# Patient Record
Sex: Female | Born: 1960 | Race: Black or African American | Hispanic: No | Marital: Single | Smoking: Current some day smoker
Health system: Southern US, Community
[De-identification: ages and names within clinical notes are randomized; demographics above are authoritative.]

## PROBLEM LIST (undated history)

## (undated) DIAGNOSIS — D696 Thrombocytopenia, unspecified: Secondary | ICD-10-CM

## (undated) DIAGNOSIS — N921 Excessive and frequent menstruation with irregular cycle: Secondary | ICD-10-CM

## (undated) DIAGNOSIS — D649 Anemia, unspecified: Secondary | ICD-10-CM

## (undated) DIAGNOSIS — D259 Leiomyoma of uterus, unspecified: Secondary | ICD-10-CM

## (undated) DIAGNOSIS — N12 Tubulo-interstitial nephritis, not specified as acute or chronic: Secondary | ICD-10-CM

## (undated) HISTORY — PX: TUBAL LIGATION: SHX77

## (undated) HISTORY — DX: Excessive and frequent menstruation with irregular cycle: N92.1

## (undated) HISTORY — DX: Tubulo-interstitial nephritis, not specified as acute or chronic: N12

## (undated) HISTORY — DX: Leiomyoma of uterus, unspecified: D25.9

## (undated) HISTORY — DX: Anemia, unspecified: D64.9

## (undated) HISTORY — DX: Thrombocytopenia, unspecified: D69.6

---

## 1997-09-20 ENCOUNTER — Other Ambulatory Visit: Admission: RE | Admit: 1997-09-20 | Discharge: 1997-09-20 | Payer: Self-pay | Admitting: *Deleted

## 1997-11-19 ENCOUNTER — Inpatient Hospital Stay (HOSPITAL_COMMUNITY): Admission: RE | Admit: 1997-11-19 | Discharge: 1997-11-19 | Payer: Self-pay | Admitting: *Deleted

## 1998-02-25 ENCOUNTER — Ambulatory Visit (HOSPITAL_COMMUNITY): Admission: RE | Admit: 1998-02-25 | Discharge: 1998-02-25 | Payer: Self-pay | Admitting: Obstetrics

## 1998-03-10 ENCOUNTER — Encounter (HOSPITAL_COMMUNITY): Admission: RE | Admit: 1998-03-10 | Discharge: 1998-04-12 | Payer: Self-pay | Admitting: *Deleted

## 1998-04-11 ENCOUNTER — Inpatient Hospital Stay (HOSPITAL_COMMUNITY): Admission: AD | Admit: 1998-04-11 | Discharge: 1998-04-13 | Payer: Self-pay | Admitting: *Deleted

## 1998-06-18 ENCOUNTER — Emergency Department (HOSPITAL_COMMUNITY): Admission: EM | Admit: 1998-06-18 | Discharge: 1998-06-18 | Payer: Self-pay | Admitting: Emergency Medicine

## 2000-01-11 ENCOUNTER — Other Ambulatory Visit: Admission: RE | Admit: 2000-01-11 | Discharge: 2000-01-11 | Payer: Self-pay | Admitting: Internal Medicine

## 2005-07-27 ENCOUNTER — Emergency Department (HOSPITAL_COMMUNITY): Admission: EM | Admit: 2005-07-27 | Discharge: 2005-07-27 | Payer: Self-pay | Admitting: Emergency Medicine

## 2006-12-30 ENCOUNTER — Emergency Department (HOSPITAL_COMMUNITY): Admission: EM | Admit: 2006-12-30 | Discharge: 2006-12-30 | Payer: Self-pay | Admitting: Emergency Medicine

## 2009-09-21 ENCOUNTER — Emergency Department (HOSPITAL_COMMUNITY): Admission: EM | Admit: 2009-09-21 | Discharge: 2009-09-21 | Payer: Self-pay | Admitting: Emergency Medicine

## 2011-02-16 LAB — POCT URINALYSIS DIP (DEVICE)
Bilirubin Urine: NEGATIVE
Glucose, UA: NEGATIVE
Ketones, ur: NEGATIVE
Protein, ur: 100 — AB
Specific Gravity, Urine: >=1.03
Urobilinogen, UA: 0.2
pH: 6

## 2011-03-15 ENCOUNTER — Encounter: Payer: Self-pay | Admitting: *Deleted

## 2011-03-15 ENCOUNTER — Emergency Department (INDEPENDENT_AMBULATORY_CARE_PROVIDER_SITE_OTHER)
Admission: EM | Admit: 2011-03-15 | Discharge: 2011-03-15 | Disposition: A | Discharge status: Home / Self Care | Payer: BC Managed Care – PPO | Source: Home / Self Care | Attending: Family Medicine | Admitting: Family Medicine

## 2011-03-15 DIAGNOSIS — H00019 Hordeolum externum unspecified eye, unspecified eyelid: Secondary | ICD-10-CM

## 2011-03-15 DIAGNOSIS — H00016 Hordeolum externum left eye, unspecified eyelid: Secondary | ICD-10-CM

## 2011-03-15 MED ORDER — AMOXICILLIN 500 MG PO CAPS: 500.0000 mg | ORAL_CAPSULE | Freq: Three times a day (TID) | ORAL | Status: AC

## 2011-03-15 NOTE — ED Provider Notes (Signed)
History     CSN: [id number] Arrival date & time: 03/15/2011  3:18 PM   First MD Initiated Contact with Patient 03/15/11 1628      Chief Complaint  Patient presents with  . Stye    (Consider location/radiation/quality/duration/timing/severity/associated sxs/prior treatment) Patient is a 50 y.o. female presenting with eye problem.  Eye Problem  This is a new problem. The current episode started more than 1 week ago. The problem occurs constantly. The problem has been gradually worsening. There is pain in the left eye. There was no injury mechanism. The pain is moderate. There is no history of trauma to the eye. There is no known exposure to pink eye. She does not wear contacts. Pertinent negatives include no discharge, no photophobia and no eye redness. Treatments tried: warm compresses.    History reviewed. No pertinent past medical history.  History reviewed. No pertinent past surgical history.  Family History  Problem Relation Age of Onset  . Hypertension Mother   . Kidney failure Father     History  Substance Use Topics  . Smoking status: Current Some Day Smoker  . Smokeless tobacco: Not on file  . Alcohol Use: Yes     Socially    OB History    Grav Para Term Preterm Abortions TAB SAB Ect Mult Living                  Review of Systems  Constitutional: Negative.   HENT: Negative.   Eyes: Positive for pain. Negative for photophobia, discharge, redness, itching and visual disturbance.    Allergies  Review of patient's allergies indicates no known allergies.  Home Medications  No current outpatient prescriptions on file.  BP 139/78  Pulse 98  Temp(Src) 98.5 F (36.9 C) (Oral)  Resp 16  SpO2 100%  LMP 03/15/2011  Physical Exam  Constitutional: She appears well-developed and well-nourished.  HENT:  Head: Normocephalic and atraumatic.  Eyes: EOM are normal. Pupils are equal, round, and reactive to light. Right eye exhibits no discharge. Left eye exhibits  hordeolum. Left eye exhibits no discharge.      ED Course  Procedures (including critical care time)  Labs Reviewed - No data to display No results found.   No diagnosis found.    MDM  Will refer to ophthalmology for definitive management; will start oral antibiotics for surrounding erythema and swelling        Richardo Priest, MD 03/15/11 1701

## 2011-03-15 NOTE — ED Notes (Signed)
Pt  Noticed  A  Small  Bump  l  Eyelid   X  6  Days    Developed  Into a  Stye  And  Now  Is  Swollen  And  Tender       Pt  Has  Been applying warm  Compresses  To affected  Area

## 2011-03-15 NOTE — ED Notes (Signed)
Pt comes in with stye above left eye lid that started on Friday but worsened by Monday.itching and pain with blinking and h/a.no blurry vision or dizziness

## 2013-01-15 ENCOUNTER — Emergency Department (INDEPENDENT_AMBULATORY_CARE_PROVIDER_SITE_OTHER)
Admission: EM | Admit: 2013-01-15 | Discharge: 2013-01-15 | Disposition: A | Discharge status: Home / Self Care | Payer: BC Managed Care – PPO | Source: Home / Self Care | Attending: Emergency Medicine | Admitting: Emergency Medicine

## 2013-01-15 ENCOUNTER — Encounter (HOSPITAL_COMMUNITY): Payer: Self-pay | Admitting: Emergency Medicine

## 2013-01-15 ENCOUNTER — Emergency Department (INDEPENDENT_AMBULATORY_CARE_PROVIDER_SITE_OTHER)
Admission: EM | Admit: 2013-01-15 | Discharge: 2013-01-15 | Disposition: A | Discharge status: Home / Self Care | Payer: BC Managed Care – PPO | Source: Home / Self Care | Attending: Family Medicine | Admitting: Family Medicine

## 2013-01-15 DIAGNOSIS — D696 Thrombocytopenia, unspecified: Secondary | ICD-10-CM

## 2013-01-15 DIAGNOSIS — D6959 Other secondary thrombocytopenia: Secondary | ICD-10-CM

## 2013-01-15 DIAGNOSIS — E876 Hypokalemia: Secondary | ICD-10-CM | POA: Diagnosis present

## 2013-01-15 DIAGNOSIS — N10 Acute tubulo-interstitial nephritis: Secondary | ICD-10-CM

## 2013-01-15 DIAGNOSIS — N12 Tubulo-interstitial nephritis, not specified as acute or chronic: Secondary | ICD-10-CM | POA: Diagnosis present

## 2013-01-15 DIAGNOSIS — D751 Secondary polycythemia: Secondary | ICD-10-CM

## 2013-01-15 DIAGNOSIS — D500 Iron deficiency anemia secondary to blood loss (chronic): Principal | ICD-10-CM | POA: Diagnosis present

## 2013-01-15 DIAGNOSIS — A498 Other bacterial infections of unspecified site: Secondary | ICD-10-CM

## 2013-01-15 DIAGNOSIS — N920 Excessive and frequent menstruation with regular cycle: Secondary | ICD-10-CM

## 2013-01-15 DIAGNOSIS — N390 Urinary tract infection, site not specified: Secondary | ICD-10-CM

## 2013-01-15 DIAGNOSIS — D250 Submucous leiomyoma of uterus: Secondary | ICD-10-CM | POA: Diagnosis present

## 2013-01-15 DIAGNOSIS — F17200 Nicotine dependence, unspecified, uncomplicated: Secondary | ICD-10-CM | POA: Diagnosis present

## 2013-01-15 DIAGNOSIS — D509 Iron deficiency anemia, unspecified: Secondary | ICD-10-CM

## 2013-01-15 LAB — POCT URINALYSIS DIP (DEVICE)
Bilirubin Urine: NEGATIVE
Ketones, ur: NEGATIVE mg/dL
Nitrite: POSITIVE — AB
Protein, ur: 100 mg/dL — AB
Urobilinogen, UA: 0.2 mg/dL (ref 0.0–1.0)

## 2013-01-15 MED ORDER — IBUPROFEN 800 MG PO TABS
ORAL_TABLET | ORAL | Status: AC
  Filled 2013-01-15: qty 1

## 2013-01-15 MED ORDER — ACETAMINOPHEN 325 MG PO TABS
650.0000 mg | ORAL_TABLET | Freq: Once | ORAL | Status: AC
  Administered 2013-01-15: 650 mg via ORAL

## 2013-01-15 MED ORDER — CEPHALEXIN 500 MG PO CAPS: 500.0000 mg | ORAL_CAPSULE | Freq: Three times a day (TID) | ORAL | Status: DC

## 2013-01-15 NOTE — ED Notes (Signed)
Pt c/o poss UTI sxs onset last night Sxs include: lower abd pain, dysuria, fever of 101 today Denies: hematuria, gyn sxs, n/v Alert w/no signs of acute distress.

## 2013-01-15 NOTE — ED Notes (Signed)
Pt. reports persistent low back pain , chills and  bladder pain seen at [facility] this morning diagnosed with uti prescribed with oral antibiotic.

## 2013-01-15 NOTE — ED Notes (Signed)
Called to assess patient.  Patient shivering.  Patient complaining of feeling worse, low abdominal pain and low back pain.  Patient feeling worse in general.  Patient took acetaminophen around 1:00pm, took cephalexin around 3:00 pm today.  Took nap, woke from nap to some stressful situations, did a lot of activity.  Now feeling worse.  Checked temp 102.3 oral.  Encouraged patient to take acetaminophen.  Patient took her own acetaminophen.  Patient wanting to be reevaluated.  Sent to lobby to wait for turn

## 2013-01-15 NOTE — ED Provider Notes (Signed)
Deborah Cabrera is a 52 y.o. female who presents to Urgent Care today for urinary tract infection symptoms. Patient notes burning dysuria urgency and frequency. Symptoms started yesterday. Patient denies significant nausea vomiting or diarrhea. She notes mild low abdominal and back pain. She has mild fever. Her symptoms are consistent with prior urinary tract infection. She's not tried any medications yet.   History reviewed. No pertinent past medical history. History  Substance Use Topics  . Smoking status: Current Some Day Smoker  . Smokeless tobacco: Not on file  . Alcohol Use: Yes     Comment: Socially   ROS as above Medications reviewed. No current facility-administered medications for this encounter.   Current Outpatient Prescriptions  Medication Sig Dispense Refill  . cephALEXin (KEFLEX) 500 MG capsule Take 1 capsule (500 mg total) by mouth 3 (three) times daily.  21 capsule  0    Exam:  BP 122/63  Pulse 116  Temp(Src) 101 F (38.3 C) (Oral)  Resp 16  SpO2 100%  LMP 12/20/2012 Gen: Well NAD HEENT: EOMI,  MMM Lungs: CTABL Nl WOB Heart: RRR no MRG Abd: NABS, NT, ND no rebound or guarding. No CV angle tenderness to percussion Exts: Non edematous BL  LE, warm and well perfused.   Results for orders placed during the hospital encounter of 01/15/13 (from the past 24 hour(s))  POCT URINALYSIS DIP (DEVICE)     Status: Abnormal   Collection Time    01/15/13 11:46 AM      Result Value Range   Glucose, UA NEGATIVE  NEGATIVE mg/dL   Bilirubin Urine NEGATIVE  NEGATIVE   Ketones, ur NEGATIVE  NEGATIVE mg/dL   Specific Gravity, Urine 1.015  1.005 - 1.030   Hgb urine dipstick LARGE (*) NEGATIVE   pH 6.0  5.0 - 8.0   Protein, ur 100 (*) NEGATIVE mg/dL   Urobilinogen, UA 0.2  0.0 - 1.0 mg/dL   Nitrite POSITIVE (*) NEGATIVE   Leukocytes, UA LARGE (*) NEGATIVE   No results found.  Assessment and Plan: 52 y.o. female with urinary tract infection.  No evidence of  pyelonephritis.  Plan to treat empirically with Keflex. Urine culture is pending Discussed warning signs or symptoms. Please see discharge instructions. Patient expresses understanding.      Rodolph Bong, MD 01/15/13 [phone]

## 2013-01-15 NOTE — ED Provider Notes (Signed)
Chief Complaint:   Chief Complaint  Patient presents with  . Back Pain  . Abdominal Pain    History of Present Illness:   Deborah Cabrera is a 52 year old female who's had a two-day history that began with dysuria, hematuria, bilateral CVA area pain, suprapubic pain, and fever. She was seen here this morning and her temperature was 101. Her urinalysis showed white blood cells, red blood cells, and was positive for nitrites. She did not have any nausea or vomiting. She was given cephalexin and sent home. She returns this evening because she feels worse. She's taken one dose of cephalexin. Her temperature is up to 103.1 as of right now which is after 1000 mg of acetaminophen. She has chills, aches, and malaise. She denies any nausea or vomiting.  Review of Systems:  Other than noted above, the patient denies any of the following symptoms: General:  No fevers, chills, sweats, aches, or fatigue. GI:  No abdominal pain, back pain, nausea, vomiting, diarrhea, or constipation. GU:  No dysuria, frequency, urgency, hematuria, or incontinence. GYN:  No discharge, itching, vulvar pain or lesions, pelvic pain, or abnormal vaginal bleeding.  PMFSH:  Past medical history, family history, social history, meds, and allergies were reviewed.    Physical Exam:   Vital signs:  BP 127/85  Pulse 129  Temp(Src) 103.1 F (39.5 C) (Oral)  Resp 20  SpO2 97%  LMP 12/20/2012 Gen:  Alert, oriented, in no distress. Lungs:  Clear to auscultation, no wheezes, rales or rhonchi. Heart:  Regular rhythm, no gallop or murmer. Abdomen:  Flat and soft. There was slight suprapubic pain to palpation.  No guarding, or rebound.  No hepato-splenomegaly or mass.  Bowel sounds were normally active.  No hernia. Back:  No CVA tenderness.  Skin:  Clear, warm and dry.  Labs:    Results for orders placed during the hospital encounter of 01/15/13  POCT URINALYSIS DIP (DEVICE)      Result Value Range   Glucose, UA NEGATIVE   NEGATIVE mg/dL   Bilirubin Urine NEGATIVE  NEGATIVE   Ketones, ur NEGATIVE  NEGATIVE mg/dL   Specific Gravity, Urine 1.015  1.005 - 1.030   Hgb urine dipstick LARGE (*) NEGATIVE   pH 6.0  5.0 - 8.0   Protein, ur 100 (*) NEGATIVE mg/dL   Urobilinogen, UA 0.2  0.0 - 1.0 mg/dL   Nitrite POSITIVE (*) NEGATIVE   Leukocytes, UA LARGE (*) NEGATIVE     A urine culture was obtained.  Results are pending at this time and we will call about any positive results.  Course in Urgent Care Center:   She was given ibuprofen 400 mg by mouth for the fever.  Assessment: The encounter diagnosis was Acute pyelonephritis.   She has failed to respond to outpatient treatment. I don't think just giving her a shot of Rocephin and sending her home is going to be appropriate tonight. I think she'll need IV fluids and IV antibiotics.  Plan:  The patient was transferred to the ED via shuttle in stable condition.  Medical Decision Making:  52 year old female has 2 day history of dysuria, hematuria, back pain, abdominal pain and fever.  She was here earlier today where UA was positive for WBCs, RBCs, and nitrites.  Was given cephalexin PO, but comes back tonight feeling worse with fever 103.1 (after Tylenol).  Appears to have acute pyelo with outpatient treatment failure.       Reuben Likes, MD 01/15/13 2049

## 2013-01-15 NOTE — ED Notes (Signed)
C/o back pain which started today.  Patient states she has pain in her stomach also for two days now.

## 2013-01-16 ENCOUNTER — Encounter (HOSPITAL_COMMUNITY): Payer: Self-pay | Admitting: *Deleted

## 2013-01-16 ENCOUNTER — Inpatient Hospital Stay (HOSPITAL_COMMUNITY)
Admission: EM | Admit: 2013-01-16 | Discharge: 2013-01-19 | DRG: 395 | Disposition: A | Discharge status: Home / Self Care | Payer: BC Managed Care – PPO | Attending: Internal Medicine | Admitting: Internal Medicine

## 2013-01-16 ENCOUNTER — Inpatient Hospital Stay (HOSPITAL_COMMUNITY): Payer: BC Managed Care – PPO

## 2013-01-16 DIAGNOSIS — D259 Leiomyoma of uterus, unspecified: Secondary | ICD-10-CM | POA: Diagnosis present

## 2013-01-16 DIAGNOSIS — R195 Other fecal abnormalities: Secondary | ICD-10-CM

## 2013-01-16 DIAGNOSIS — D649 Anemia, unspecified: Secondary | ICD-10-CM | POA: Diagnosis present

## 2013-01-16 DIAGNOSIS — D509 Iron deficiency anemia, unspecified: Secondary | ICD-10-CM

## 2013-01-16 DIAGNOSIS — N12 Tubulo-interstitial nephritis, not specified as acute or chronic: Secondary | ICD-10-CM | POA: Diagnosis present

## 2013-01-16 DIAGNOSIS — D696 Thrombocytopenia, unspecified: Secondary | ICD-10-CM

## 2013-01-16 DIAGNOSIS — E876 Hypokalemia: Secondary | ICD-10-CM

## 2013-01-16 HISTORY — DX: Thrombocytopenia, unspecified: D69.6

## 2013-01-16 HISTORY — DX: Tubulo-interstitial nephritis, not specified as acute or chronic: N12

## 2013-01-16 LAB — CBC WITH DIFFERENTIAL/PLATELET
Basophils Absolute: 0 10*3/uL (ref 0.0–0.1)
Basophils Relative: 0 % (ref 0–1)
Basophils Relative: 1 % (ref 0–1)
Basophils Relative: 1 % (ref 0–1)
Eosinophils Absolute: 0 10*3/uL (ref 0.0–0.7)
Eosinophils Relative: 1 % (ref 0–5)
HCT: 24.3 % — ABNORMAL LOW (ref 36.0–46.0)
Hemoglobin: 4.4 g/dL — CL (ref 12.0–15.0)
Hemoglobin: 7.1 g/dL — ABNORMAL LOW (ref 12.0–15.0)
Lymphocytes Relative: 4 % — ABNORMAL LOW (ref 12–46)
Lymphocytes Relative: 5 % — ABNORMAL LOW (ref 12–46)
Lymphocytes Relative: 8 % — ABNORMAL LOW (ref 12–46)
MCH: 23.8 pg — ABNORMAL LOW (ref 26.0–34.0)
MCH: 22.8 pg — ABNORMAL LOW (ref 26.0–34.0)
MCHC: 30.6 g/dL (ref 30.0–36.0)
MCHC: 29.2 g/dL — ABNORMAL LOW (ref 30.0–36.0)
MCV: 72.9 fL — ABNORMAL LOW (ref 78.0–100.0)
MCV: 77.7 fL — ABNORMAL LOW (ref 78.0–100.0)
Monocytes Relative: 8 % (ref 3–12)
Neutro Abs: 9 10*3/uL — ABNORMAL HIGH (ref 1.7–7.7)
Neutro Abs: 8.4 10*3/uL — ABNORMAL HIGH (ref 1.7–7.7)
Neutrophils Relative %: 92 % — ABNORMAL HIGH (ref 43–77)
Neutrophils Relative %: 82 % — ABNORMAL HIGH (ref 43–77)
Platelets: 27 10*3/uL — CL (ref 150–400)
Platelets: 25 10*3/uL — CL (ref 150–400)
RDW: 32.2 % — ABNORMAL HIGH (ref 11.5–15.5)
RDW: 28.2 % — ABNORMAL HIGH (ref 11.5–15.5)
WBC: 11 10*3/uL — ABNORMAL HIGH (ref 4.0–10.5)

## 2013-01-16 LAB — BASIC METABOLIC PANEL
CO2: 22 mEq/L (ref 19–32)
Calcium: 9 mg/dL (ref 8.4–10.5)
Calcium: 9.2 mg/dL (ref 8.4–10.5)
Creatinine, Ser: 0.71 mg/dL (ref 0.50–1.10)
GFR calc Af Amer: >90 mL/min (ref >90)
GFR calc Af Amer: >90 mL/min (ref >90)
GFR calc non Af Amer: >90 mL/min (ref >90)
GFR calc non Af Amer: >90 mL/min (ref >90)
Potassium: 3.5 mEq/L (ref 3.5–5.1)
Sodium: 136 mEq/L (ref 135–145)
Sodium: 139 mEq/L (ref 135–145)

## 2013-01-16 LAB — SAMPLE TO BLOOD BANK

## 2013-01-16 LAB — RETICULOCYTES
RBC.: 2.84 MIL/uL — ABNORMAL LOW (ref 3.87–5.11)
Retic Count, Absolute: 34.1 10*3/uL (ref 19.0–186.0)
Retic Ct Pct: 1.2 % (ref 0.4–3.1)

## 2013-01-16 LAB — FOLATE: Folate: 3.2 ng/mL — ABNORMAL LOW

## 2013-01-16 LAB — URINALYSIS, ROUTINE W REFLEX MICROSCOPIC
Bilirubin Urine: NEGATIVE
Nitrite: NEGATIVE
Protein, ur: 30 mg/dL — AB
pH: 6.5 (ref 5.0–8.0)

## 2013-01-16 LAB — URINE MICROSCOPIC-ADD ON

## 2013-01-16 LAB — MRSA PCR SCREENING: MRSA by PCR: NEGATIVE

## 2013-01-16 LAB — DIC (DISSEMINATED INTRAVASCULAR COAGULATION)PANEL
INR: 1.22 (ref 0.00–1.49)
aPTT: 39 seconds — ABNORMAL HIGH (ref 24–37)

## 2013-01-16 LAB — IRON AND TIBC
Saturation Ratios: 6 % — ABNORMAL LOW (ref 20–55)
UIBC: 403 ug/dL — ABNORMAL HIGH (ref 125–400)

## 2013-01-16 LAB — OCCULT BLOOD X 1 CARD TO LAB, STOOL: Fecal Occult Bld: NEGATIVE

## 2013-01-16 LAB — TROPONIN I: Troponin I: <0.3 ng/mL (ref <0.30)

## 2013-01-16 LAB — DIRECT ANTIGLOBULIN TEST (NOT AT ARMC)
DAT, IgG: NEGATIVE
DAT, complement: NEGATIVE

## 2013-01-16 LAB — VITAMIN B12: Vitamin B-12: 367 pg/mL (ref 211–911)

## 2013-01-16 LAB — HEPATIC FUNCTION PANEL
Albumin: 3.2 g/dL — ABNORMAL LOW (ref 3.5–5.2)
Total Bilirubin: 0.4 mg/dL (ref 0.3–1.2)
Total Protein: 6.3 g/dL (ref 6.0–8.3)

## 2013-01-16 LAB — CG4 I-STAT (LACTIC ACID): Lactic Acid, Venous: 1.58 mmol/L (ref 0.5–2.2)

## 2013-01-16 LAB — LACTATE DEHYDROGENASE: LDH: 167 U/L (ref 94–250)

## 2013-01-16 LAB — PREPARE RBC (CROSSMATCH)

## 2013-01-16 MED ORDER — SODIUM CHLORIDE 0.9 % IV BOLUS (SEPSIS)
1000.0000 mL | Freq: Once | INTRAVENOUS | Status: AC
  Administered 2013-01-16: 1000 mL via INTRAVENOUS

## 2013-01-16 MED ORDER — ONDANSETRON HCL 4 MG/2ML IJ SOLN
4.0000 mg | Freq: Four times a day (QID) | INTRAMUSCULAR | Status: DC | PRN
Start: 2013-01-16 — End: 2013-01-19
  Administered 2013-01-16: 4 mg via INTRAVENOUS
  Filled 2013-01-16: qty 2

## 2013-01-16 MED ORDER — IOHEXOL 300 MG/ML  SOLN
80.0000 mL | Freq: Once | INTRAMUSCULAR | Status: AC | PRN
  Administered 2013-01-16: 80 mL via INTRAVENOUS

## 2013-01-16 MED ORDER — ALUM & MAG HYDROXIDE-SIMETH 200-200-20 MG/5ML PO SUSP
30.0000 mL | Freq: Four times a day (QID) | ORAL | Status: DC | PRN
Start: 2013-01-16 — End: 2013-01-19
  Administered 2013-01-16 – 2013-01-18 (×2): 30 mL via ORAL
  Filled 2013-01-16 (×2): qty 30

## 2013-01-16 MED ORDER — DEXTROSE 5 % IV SOLN
1.0000 g | INTRAVENOUS | Status: DC
  Administered 2013-01-16 – 2013-01-18 (×2): 1 g via INTRAVENOUS
  Filled 2013-01-16 (×3): qty 10

## 2013-01-16 MED ORDER — ACETAMINOPHEN 325 MG PO TABS
650.0000 mg | ORAL_TABLET | Freq: Four times a day (QID) | ORAL | Status: DC | PRN
  Administered 2013-01-16 – 2013-01-17 (×5): 650 mg via ORAL
  Filled 2013-01-16 (×5): qty 2

## 2013-01-16 MED ORDER — ACETAMINOPHEN 650 MG RE SUPP: 650.0000 mg | Freq: Four times a day (QID) | RECTAL | Status: DC | PRN

## 2013-01-16 MED ORDER — TRAMADOL HCL 50 MG PO TABS: 50.0000 mg | ORAL_TABLET | Freq: Four times a day (QID) | ORAL | Status: DC | PRN

## 2013-01-16 MED ORDER — FUROSEMIDE 10 MG/ML IJ SOLN
20.0000 mg | Freq: Once | INTRAMUSCULAR | Status: AC
  Administered 2013-01-16: 20 mg via INTRAVENOUS
  Filled 2013-01-16 (×2): qty 2

## 2013-01-16 MED ORDER — SODIUM CHLORIDE 0.9 % IJ SOLN
3.0000 mL | Freq: Two times a day (BID) | INTRAMUSCULAR | Status: DC
  Administered 2013-01-16 – 2013-01-18 (×5): 3 mL via INTRAVENOUS

## 2013-01-16 MED ORDER — DEXTROSE 5 % IV SOLN
1.0000 g | Freq: Once | INTRAVENOUS | Status: AC
  Administered 2013-01-16: 1 g via INTRAVENOUS
  Filled 2013-01-16: qty 10

## 2013-01-16 MED ORDER — SODIUM CHLORIDE 0.9 % IV SOLN
INTRAVENOUS | Status: DC
  Administered 2013-01-16: 999 mL via INTRAVENOUS

## 2013-01-16 MED ORDER — HYDROMORPHONE HCL PF 1 MG/ML IJ SOLN
1.0000 mg | INTRAMUSCULAR | Status: DC | PRN
  Administered 2013-01-16 – 2013-01-17 (×2): 1 mg via INTRAVENOUS
  Filled 2013-01-16 (×2): qty 1

## 2013-01-16 MED ORDER — PANTOPRAZOLE SODIUM 40 MG IV SOLR
40.0000 mg | INTRAVENOUS | Status: DC
  Administered 2013-01-16: 40 mg via INTRAVENOUS
  Filled 2013-01-16 (×2): qty 40

## 2013-01-16 MED ORDER — IBUPROFEN 600 MG PO TABS
600.0000 mg | ORAL_TABLET | Freq: Once | ORAL | Status: AC
  Administered 2013-01-16: 600 mg via ORAL
  Filled 2013-01-16: qty 1

## 2013-01-16 MED ORDER — CIPROFLOXACIN IN D5W 400 MG/200ML IV SOLN
400.0000 mg | Freq: Once | INTRAVENOUS | Status: AC
  Administered 2013-01-16: 400 mg via INTRAVENOUS
  Filled 2013-01-16 (×2): qty 200

## 2013-01-16 MED ORDER — LEVALBUTEROL HCL 0.63 MG/3ML IN NEBU
0.6300 mg | INHALATION_SOLUTION | Freq: Four times a day (QID) | RESPIRATORY_TRACT | Status: DC | PRN
  Filled 2013-01-16: qty 3

## 2013-01-16 MED ORDER — POTASSIUM CHLORIDE 10 MEQ/100ML IV SOLN
10.0000 meq | INTRAVENOUS | Status: AC
  Administered 2013-01-16 (×3): 10 meq via INTRAVENOUS
  Filled 2013-01-16 (×2): qty 100

## 2013-01-16 MED ORDER — IOHEXOL 300 MG/ML  SOLN
25.0000 mL | INTRAMUSCULAR | Status: AC
  Administered 2013-01-16 (×2): 25 mL via ORAL

## 2013-01-16 MED ORDER — ONDANSETRON HCL 4 MG PO TABS: 4.0000 mg | ORAL_TABLET | Freq: Four times a day (QID) | ORAL | Status: DC | PRN

## 2013-01-16 NOTE — Consult Note (Signed)
Referring MD: Dr. Isidoro Donning  PCP:  No PCP Per Patient   Reason for Referral: Profound anemia and thrombocytopenia  Chief Complaint  Patient presents with  . Urinary Tract Infection   HPI: Patient is pleasant 52 year old female was admitted to the stepdown unit for profound anemia (Hbg of 4, Hct of 16.7, MCV of 72 and a plt count of 27K.  She works as a Curator and reports doing well until a few  Days prior to admission when she had dysuria that progressed.  She denies dyspnea on exertion.  She reports Heavy menstrual periods that require two pads occurring 4 times per day, lasting up to 2 weeks at times. Her last menstrual cycle was the week of the 16th.  She denies a history of anemia requiring blood transfusions, Iron supplementation.  She also denies a family history of anemia.  She endorses taking Goody powders  For right shoulder pain that occurs.  She takes them about twice weekly.  She reports eating ice all the  Time.  She denies clotting history or heavy alcohol use.  She usually obtains her primary care evaluation At the urgent care center.  She does not recall her last complete blood count or being told it had some  Abnormalities.  She has two children, each of whom were delivered without bleeding complications.      History reviewed. No pertinent past medical history.:  Past Surgical History  Procedure Laterality Date  . Tubal ligation Bilateral   :  . [START ON 01/17/2013] cefTRIAXone (ROCEPHIN)  IV  1 g Intravenous Q24H  . pantoprazole (PROTONIX) IV  40 mg Intravenous Q24H  . sodium chloride  3 mL Intravenous Q12H  :  No Known Allergies:  Family History  Problem Relation Age of Onset  . Hypertension Mother   . Kidney failure Father   . Diabetes Mellitus II Son   :  History   Social History  . Marital Status: Single    Spouse Name: N/A    Number of Children: N/A  . Years of Education: N/A   Occupational History  . Not on file.   Social History Main  Topics  . Smoking status: Current Some Day Smoker  . Smokeless tobacco: Not on file  . Alcohol Use: Yes     Comment: Socially  . Drug Use: No  . Sexual Activity: Yes    Birth Control/ Protection: None   Other Topics Concern  . Not on file   Social History Narrative  . No narrative on file  :  ROS: Eyes: No changes in vision Resp:  No dyspnea on exertion or cough  Cardio: No shortness of breath or chest pain GI: Denies melena or hematochezia Extremities: +R shoulder arthralgia or no joint effusions of early morning stiffness Lymph nodes: No history of enlarged lymph nodes Neurologic:  No dizziness or falls or gait instability Skin: No rashes or or easy bruising Genitourinary: No dysuria, hematuria  Vitals: Filed Vitals:   01/16/13 1800  BP: 130/83  Pulse: 99  Temp:   Resp: 20    PHYSICAL EXAM: General appearance:  Non-toxic appearing; AA O x 3.  HEENT: Conjunctiva dull; PERRL: EOMi Lymph Nodes:  No palpable cervical lymph node.  Resp:  CTAB/L; No wheezes and rhonchi.  Cardio: RR; S1, S2 present; No M/R/G.  GI: S/NT/ND + BS No organomegaly appreciated.  Extremities: No C/C/E Neurologic: No focal deficits.   Skin:  No purpura or petechiae.   Labs:  Recent Labs  01/16/13 1105 01/16/13 1556  WBC 11.0* 9.6  HGB 6.7* 7.1*  HCT 21.9* 24.3*  PLT 25*  25* 42*    Recent Labs  01/16/13 0027 01/16/13 1557  NA 136 139  K 3.0* 3.5  CL 102 104  CO2 22 19  GLUCOSE 110* 87  BUN 8 6  CREATININE 0.71 0.61  CALCIUM 9.0 9.2   Iron/TIBC/Ferritin No results found for this basename: iron, tibc, ferritin   Blood smear review: Smear reviewed demonstrating red blood cells with increased central pallor, microcytic cells and  pencil cells, abundant teardrop cells and increased variation in size and shape of RBCs. Rare schistocytes.   Decreased plts without plt clumping; Adequate white blood cells.    Images Studies/Results:  US Transvaginal Non-ob  01/16/2013    CLINICAL DATA:  Menorrhagia.  EXAM: TRANSABDOMINAL AND TRANSVAGINAL ULTRASOUND OF PELVIS  TECHNIQUE: Both transabdominal and transvaginal ultrasound examinations of the pelvis were performed. Transabdominal technique was performed for global imaging of the pelvis including uterus, ovaries, adnexal regions, and pelvic cul-de-sac. It was necessary to proceed with endovaginal exam following the transabdominal exam to visualize the endometrium and ovaries.  COMPARISON:  None  FINDINGS: Uterus  Measurements: 7.0 x 4.1 x 6.1 cm. Diffusely heterogeneous myometrial echotexture. 2 fibroids are seen in the lower uterine segment and posterior corpus which have submucosal components impinging upon the endometrial cavity. These measure 2.6 cm and 2.5 cm in maximum diameters respectively. A small posterior subserosal fibroid is seen which measures 1.0 cm.  Endometrium  Thickness: 5 mm excluding small amount of fluid seen in endometrial cavity.  Right ovary  Measurements: 2.1 x 1.2 x 1.8 cm. Normal appearance/no adnexal mass.  Left ovary  Measurements: 1.6 x 1.0 x 1.6 cm. Normal appearance/no adnexal mass.  Other findings  A small amount of free fluid is seen.  IMPRESSION: Several small uterine fibroids, 2 of which are submucosal in location.  Unremarkable ovaries. No adnexal mass identified.   Electronically Signed   By: Myles Rosenthal   On: 01/16/2013 15:58   US Pelvis Complete  01/16/2013   CLINICAL DATA:  Menorrhagia.  EXAM: TRANSABDOMINAL AND TRANSVAGINAL ULTRASOUND OF PELVIS  TECHNIQUE: Both transabdominal and transvaginal ultrasound examinations of the pelvis were performed. Transabdominal technique was performed for global imaging of the pelvis including uterus, ovaries, adnexal regions, and pelvic cul-de-sac. It was necessary to proceed with endovaginal exam following the transabdominal exam to visualize the endometrium and ovaries.  COMPARISON:  None  FINDINGS: Uterus  Measurements: 7.0 x 4.1 x 6.1 cm. Diffusely  heterogeneous myometrial echotexture. 2 fibroids are seen in the lower uterine segment and posterior corpus which have submucosal components impinging upon the endometrial cavity. These measure 2.6 cm and 2.5 cm in maximum diameters respectively. A small posterior subserosal fibroid is seen which measures 1.0 cm.  Endometrium  Thickness: 5 mm excluding small amount of fluid seen in endometrial cavity.  Right ovary  Measurements: 2.1 x 1.2 x 1.8 cm. Normal appearance/no adnexal mass.  Left ovary  Measurements: 1.6 x 1.0 x 1.6 cm. Normal appearance/no adnexal mass.  Other findings  A small amount of free fluid is seen.  IMPRESSION: Several small uterine fibroids, 2 of which are submucosal in location.  Unremarkable ovaries. No adnexal mass identified.   Electronically Signed   By: Myles Rosenthal   On: 01/16/2013 15:58   Dg Chest Port 1 View  01/16/2013   CLINICAL DATA:  Chest pain and shortness of Breath.  EXAM: PORTABLE CHEST - 1 VIEW  COMPARISON:  07/27/2005.  FINDINGS: The heart size and mediastinal contours are within normal limits given the AP projection. Both lungs are clear. The visualized skeletal structures are unremarkable.  IMPRESSION: No active disease.   Electronically Signed   By: Loralie Champagne M.D.   On: 01/16/2013 17:44     Pathology: None.    Assessment: Principal Problem:   Anemia Active Problems:   Pyelonephritis   Thrombocytopenia   1. Microcytic anemia likely 2/2 iron-defiency anemia due to chronic metrorrhagia/menorrhagia from uterine fibroids +/- primary bone marrow hypoproliferative anemia. 2. 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 Liver disease induced vs. Spleen enlargement.  DIC (low d-dimer, high fibrogen, normal INR, non-toxic)  And TTP (rare schistocytes)  less likely.    Recommendation: 1. Check iron studies and ferritin, retic.  Likely can replete iron intravenously based on deficit.  2. Follow results  of infections testing including acute hepatitis panel and HIV.   Check ultrasound of abdomen to rule-out spleen enlargement.  3. Transfuse packed RBCs based on symptoms.  Her anemia is likely chronic.  4. Maintain plts greater than 50K for procedure, i.e., colonoscopy.  5. We will follow this patient with you.   Rachael Zapanta 01/16/2013, 8:41 PM

## 2013-01-16 NOTE — H&P (Signed)
[facility] History and Physical  Deborah Cabrera QMV:[id number] DOB: 08-Jun-1960 DOA: 01/16/2013  Referring physician: ER physician. PCP: No PCP Per Patient   Chief Complaint: Fever and back pain.  HPI: Deborah Cabrera is a 52 y.o. female presented to the urgent care yesterday with back pain and fever. Patient was diagnosed with pyelonephritis and sent home on Keflex. Despite taking one dose patient's still had flank pain with fever with dysuria and presented back to the urgent care. Patient was sent to the ER. In the ER patient was found to have fever 103F. Labs showed a hemoglobin of around 4. In addition patient's chest is around 27. Patient has been taking Goody powders for joint pains. Patient also has been having heavy menstrual cycles and the last menstrual cycle was on August 16. Patient denies having noticed any blood in the stools or black stools. Patient presently is hemodynamically stable and has been admitted for further management. Patient denies any chest pain or shortness of breath. Flank pain has improved.  Review of Systems: As presented in the history of presenting illness, rest negative.  History reviewed. No pertinent past medical history. Past Surgical History  Procedure Laterality Date  . Tubal ligation Bilateral    Social History:  reports that she has been smoking.  She does not have any smokeless tobacco history on file. She reports that  drinks alcohol. She reports that she does not use illicit drugs. Home. where does patient live-- Can do ADLs. Can patient participate in ADLs?  No Known Allergies  Family History  Problem Relation Age of Onset  . Hypertension Mother   . Kidney failure Father   . Diabetes Mellitus II Son       Prior to Admission medications   Medication Sig Start Date End Date Taking? Authorizing Provider  acetaminophen (TYLENOL) 325 MG tablet Take 650 mg by mouth every 6 (six) hours as needed for pain.   Yes Historical Provider, MD   cephALEXin (KEFLEX) 500 MG capsule Take 1 capsule (500 mg total) by mouth 3 (three) times daily. 01/15/13  Yes Rodolph Bong, MD  ibuprofen (ADVIL,MOTRIN) 200 MG tablet Take 400 mg by mouth every 6 (six) hours as needed for pain.   Yes Historical Provider, MD   Physical Exam: Filed Vitals:   01/16/13 0336 01/16/13 0427 01/16/13 0519 01/16/13 0531  BP: 117/60  121/65 127/69  Pulse: 88     Temp: 98.3 F (36.8 C) 98.4 F (36.9 C) 98.7 F (37.1 C) 98.8 F (37.1 C)  TempSrc: Oral Oral Oral Oral  Resp: 14     Height:  5\' 6"  (1.676 m)    Weight:  81.965 kg (180 lb 11.2 oz)    SpO2: 99%        General:  Well-developed well nourished.  Eyes pallor present no icterus.  ENT: No discharge from the ears eyes nose mouth.  Neck: No mass felt.  Cardiovascular: S1-S2 heard.  Respiratory: No rhonchi or crepitations.  Abdomen: Soft nontender bowel sounds present.  Skin: No rash.  Musculoskeletal: No edema.  Psychiatric: Appears normal.  Neurologic: Alert awake oriented to time place and person. Moves all extremities.  Labs on Admission:  Basic Metabolic Panel:  Recent Labs Lab 01/16/13 0027  NA 136  K 3.0*  CL 102  CO2 22  GLUCOSE 110*  BUN 8  CREATININE 0.71  CALCIUM 9.0   Liver Function Tests:  Recent Labs Lab 01/16/13 0353  AST 15  ALT 10  ALKPHOS 78  BILITOT 0.4  PROT 6.3  ALBUMIN 3.2*   No results found for this basename: LIPASE, AMYLASE,  in the last 168 hours No results found for this basename: AMMONIA,  in the last 168 hours CBC:  Recent Labs Lab 01/16/13 0027 01/16/13 0153  WBC 13.9*  --   NEUTROABS 12.7*  --   HGB 4.4* 4.4*  HCT 16.7* 16.1*  MCV 72.9*  --   PLT 27*  --    Cardiac Enzymes: No results found for this basename: CKTOTAL, CKMB, CKMBINDEX, TROPONINI,  in the last 168 hours  BNP (last 3 results) No results found for this basename: PROBNP,  in the last 8760 hours CBG: No results found for this basename: GLUCAP,  in the last 168  hours  Radiological Exams on Admission: No results found.    Assessment/Plan Principal Problem:   Anemia Active Problems:   Pyelonephritis   Thrombocytopenia   1. Severe anemia microcytic hypochromic - probably secondary to heavy menstrual cycle but we'll also have to rule out GI bleed since patient takes [place] powder very often. Check stool for occult blood. Patient has been already started on transfusion. Recheck hemoglobin after transfusion. May need GI workup also. For now her Patient on clear liquid diet. 2. Thrombocytopenia - consult hematology for further workup. Check HIV. 3. Pyelonephritis - continue ceftriaxone. Follow urine cultures. If flank pain recurs may need sonogram of the abdomen are CT to rule out any obstruction. 4. Tobacco abuse - strongly advised to quit smoking.    Code Status: Full code.  Family Communication: None.  Disposition Plan: Admit to inpatient.    KAKRAKANDY,ARSHAD N. [facility] Pager [phone].  If 7PM-7AM, please contact night-coverage [email or web address] Password [facility] 01/16/2013, 6:11 AM

## 2013-01-16 NOTE — Significant Event (Signed)
Called lab again for no results for CBC that was collected at 1105 and then again requested to be added on at 1400.  No results in computer, discussed concerns with Lab and requested that the CBC be ran for the 1105 time period to adequately reflect post transfusion results. Will continue to monitor and watch for results.  New CBC drawn via lab at this time.

## 2013-01-16 NOTE — Progress Notes (Signed)
Utilization review completed. Kail Fraley, RN, BSN. 

## 2013-01-16 NOTE — Progress Notes (Signed)
Patient ID: Deborah Cabrera  female  RUE:[id number]    DOB: [birth year]    DOA: 01/16/2013  PCP: No PCP Per Patient  Assessment/Plan: Principal Problem:  SEVERE Anemia: Differential diagnosis includes anemia from significant menorrhagia (2 weeks last month), profound thrombocytopenia, GI cause as patient has been taking Goody powders for her shoulder pain - LDH, haptoglobin within normal limits, DIC panel and anemia panel pending  - Transfuse 2 units packed rbc's, Will recheck H&H after the transfusion, will transfuse 1 units platelets. - Check CT abdomen and pelvis to rule out any GI pathology/colon mass, pelvic and transvaginal ultrasound to rule out any uterine fibroids  - Called gastroenterology and hematology consult  Active Problems: Severe Thrombocytopenia: ? Cause - See #1 - Obtain peripheral blood smear, DIC panel, transfuse 1 unit platelets - Hematology consult called, ? Steroids    Pyelonephritis - Continue IV Rocephin, follow urine cultures  Hypokalemia: Replaced   DVT Prophylaxis: SCD's  Code Status: FC  Disposition:    Subjective: Patient denies any specific complaints at this time, no active bleeding  Objective: Weight change:   Intake/Output Summary (Last 24 hours) at 01/16/13 0804 Last data filed at 01/16/13 0519  Gross per 24 hour  Intake  462.5 ml  Output      0 ml  Net  462.5 ml   Blood pressure 124/72, pulse 82, temperature 98.8 F (37.1 C), temperature source Oral, resp. rate 22, height 5\' 6"  (1.676 m), weight 81.965 kg (180 lb 11.2 oz), last menstrual period 12/20/2012, SpO2 98.00%.  Physical Exam: General: Alert and awake, oriented x3, not in any acute distress. HEENT: Pale conjunctiva, PERLA CVS: S1-S2 clear, no murmur rubs or gallops Chest: clear to auscultation bilaterally, no wheezing, rales or rhonchi Abdomen: soft nontender, nondistended, normal bowel sounds  Extremities: no cyanosis, clubbing or edema noted bilaterally Neuro: Cranial  nerves II-XII intact, no focal neurological deficits  Lab Results: Basic Metabolic Panel:  Recent Labs Lab 01/16/13 0027  NA 136  K 3.0*  CL 102  CO2 22  GLUCOSE 110*  BUN 8  CREATININE 0.71  CALCIUM 9.0   Liver Function Tests:  Recent Labs Lab 01/16/13 0353  AST 15  ALT 10  ALKPHOS 78  BILITOT 0.4  PROT 6.3  ALBUMIN 3.2*   No results found for this basename: LIPASE, AMYLASE,  in the last 168 hours No results found for this basename: AMMONIA,  in the last 168 hours CBC:  Recent Labs Lab 01/16/13 0027 01/16/13 0153  WBC 13.9*  --   NEUTROABS 12.7*  --   HGB 4.4* 4.4*  HCT 16.7* 16.1*  MCV 72.9*  --   PLT 27*  --    Cardiac Enzymes: No results found for this basename: CKTOTAL, CKMB, CKMBINDEX, TROPONINI,  in the last 168 hours BNP: No components found with this basename: POCBNP,  CBG: No results found for this basename: GLUCAP,  in the last 168 hours   Micro Results: Recent Results (from the past 240 hour(s))  MRSA PCR SCREENING     Status: None   Collection Time    01/16/13  4:28 AM      Result Value Range Status   MRSA by PCR NEGATIVE  NEGATIVE Final   Comment:            The GeneXpert MRSA Assay (FDA     approved for NASAL specimens     only), is one component of a     comprehensive MRSA colonization  surveillance program. It is not     intended to diagnose MRSA     infection nor to guide or     monitor treatment for     MRSA infections.    Studies/Results: No results found.  Medications: Scheduled Meds: . [START ON 01/17/2013] cefTRIAXone (ROCEPHIN)  IV  1 g Intravenous Q24H  . pantoprazole (PROTONIX) IV  40 mg Intravenous Q24H  . potassium chloride  10 mEq Intravenous Q1 Hr x 3  . sodium chloride  3 mL Intravenous Q12H      LOS: 0 days   RAI,RIPUDEEP M.D. [facility] 01/16/2013, 8:04 AM Pager: [phone]  If 7PM-7AM, please contact night-coverage [email or web address] Password [email or web address]

## 2013-01-16 NOTE — Significant Event (Signed)
Called lab with concerns of no results from Post transfusion CBC.  Requested that the lab be added on to current labs from 1105 (order placed at 1049).  Awaiting results, will continue to monitor

## 2013-01-16 NOTE — Consult Note (Signed)
Referring Provider: [facility] Primary Care Physician:  No PCP Per Patient Primary Gastroenterologist:  none  Reason for Consultation:  Profound anemia  HPI: Deborah Cabrera is a 52 y.o. female who was admitted late last evening after presenting to the emergency room with fever and back pain. She had been seen yesterday and diagnosed with pyelonephritis started on Keflex but did not feel any better and developed attempt to 103. Labs on admission showed a hemoglobin of 4 hematocrit of 16.7 MCV of 72 and a platelet count of 27,000. Patient states that she has been feeling fine recently and works full time and has a very busy life with her son's sports activities. She says perhaps she's been a little bit fatigued but hasn't noticed anything significant in the way of  changes over the past several months She had been told once that she was anemic right after she delivered her child but has had no problems with anemia as far she is aware since then. There is no family history of anemia, thalassemia,etc She is still menstruating and says that her periods are regular she generally has 4 days of heavy menses in a couple of days of light or flow. She hasn't noticed any changes recently and that pattern and her last period was about a month ago. He does take Goody powders but not every day. She may take 1 or 2 when she has problems with shoulder pain. She's not on any other regular aspirin or NSAIDs. She is a smoker stop all socially.Her appetite has been fine, her weight is been stable, she has no complaints of dysphagia or odynophagia, no nausea or vomiting, no abdominal pain, no changes in her bowel habits, and has not noticed any melena or hematochezia. He has not had any prior GI evaluation. Iron studies are still pending this morning, and she is also being evaluated for the thrombocytopenia. She is being transfused 2 units of packed rbc's   History reviewed. No pertinent past medical history.  Past  Surgical History  Procedure Laterality Date  . Tubal ligation Bilateral     Prior to Admission medications   Medication Sig Start Date End Date Taking? Authorizing Provider  acetaminophen (TYLENOL) 325 MG tablet Take 650 mg by mouth every 6 (six) hours as needed for pain.   Yes Historical Provider, MD  cephALEXin (KEFLEX) 500 MG capsule Take 1 capsule (500 mg total) by mouth 3 (three) times daily. 01/15/13  Yes Rodolph Bong, MD  ibuprofen (ADVIL,MOTRIN) 200 MG tablet Take 400 mg by mouth every 6 (six) hours as needed for pain.   Yes Historical Provider, MD    Current Facility-Administered Medications  Medication Dose Route Frequency Provider Last Rate Last Dose  . 0.9 %  sodium chloride infusion   Intravenous Continuous Eduard Clos, MD 50 mL/hr at 01/16/13 0630 999 mL at 01/16/13 0630  . acetaminophen (TYLENOL) tablet 650 mg  650 mg Oral Q6H PRN Eduard Clos, MD   650 mg at 01/16/13 0804   Or  . acetaminophen (TYLENOL) suppository 650 mg  650 mg Rectal Q6H PRN Eduard Clos, MD      . Melene Muller ON 01/17/2013] cefTRIAXone (ROCEPHIN) 1 g in dextrose 5 % 50 mL IVPB  1 g Intravenous Q24H Eduard Clos, MD      . HYDROmorphone (DILAUDID) injection 1 mg  1 mg Intravenous Q4H PRN Ripudeep K Rai, MD      . ondansetron (ZOFRAN) tablet 4 mg  4  mg Oral Q6H PRN Eduard Clos, MD       Or  . ondansetron [facility]) injection 4 mg  4 mg Intravenous Q6H PRN Eduard Clos, MD      . pantoprazole (PROTONIX) injection 40 mg  40 mg Intravenous Q24H Eduard Clos, MD      . potassium chloride 10 mEq in 100 mL IVPB  10 mEq Intravenous Q1 Hr x 3 Ripudeep K Rai, MD   10 mEq at 01/16/13 0927  . sodium chloride 0.9 % injection 3 mL  3 mL Intravenous Q12H Eduard Clos, MD   3 mL at 01/16/13 0927  . traMADol (ULTRAM) tablet 50 mg  50 mg Oral Q6H PRN Ripudeep Jenna Luo, MD        Allergies as of 01/15/2013  . (No Known Allergies)    Family History  Problem Relation Age  of Onset  . Hypertension Mother   . Kidney failure Father   . Diabetes Mellitus II Son     History   Social History  . Marital Status: Single    Spouse Name: N/A    Number of Children: N/A  . Years of Education: N/A   Occupational History  . Not on file.   Social History Main Topics  . Smoking status: Current Some Day Smoker  . Smokeless tobacco: Not on file  . Alcohol Use: Yes     Comment: Socially  . Drug Use: No  . Sexual Activity: Yes    Birth Control/ Protection: None   Other Topics Concern  . Not on file   Social History Narrative  . No narrative on file    Review of Systems: Pertinent positive and negative review of systems were noted in the above HPI section.  All other review of systems was otherwise negative.  Physical Exam: Vital signs in last 24 hours: Temp:  [97.8 F (36.6 C)-103.1 F (39.5 C)] 97.9 F (36.6 C) (09/12 0900) Pulse Rate:  [82-129] 86 (09/12 0930) Resp:  [14-33] 18 (09/12 0930) BP: (80-139)/(60-85) 127/72 mmHg (09/12 0930) SpO2:  [96 %-100 %] 99 % (09/12 0930) Weight:  [176 lb (79.833 kg)-180 lb 11.2 oz (81.965 kg)] 180 lb 11.2 oz (81.965 kg) (09/12 0427)   General:   Alert,  Well-developed, well-nourished,AA female  pleasant and cooperative in NAD Head:  Normocephalic and atraumatic. Eyes:  Sclera clear, no icterus.   Conjunctiva pale. Ears:  Normal auditory acuity. Nose:  No deformity, discharge,  or lesions. Mouth:  No deformity or lesions.   Neck:  Supple; no masses or thyromegaly. Lungs:  Clear throughout to auscultation.   No wheezes, crackles, or rhonchi.  Heart:  Regular rate and rhythm; soft systolic murmur Abdomen:  Soft,nontender, BS active,nonpalp mass or hsm.   Rectal:  Brown heme positive stool Msk:  Symmetrical without gross deformities. . Pulses:  Normal pulses noted. Extremities:  Without clubbing or edema. Neurologic:  Alert and  oriented x4;  grossly normal neurologically. Skin:  Intact without significant  lesions or rashes.. Psych:  Alert and cooperative. Normal mood and affect.  Intake/Output from previous day: 09/11 0701 - 09/12 0700 In: 462.5 [I.V.:250; Blood:12.5; IV Piggyback:200] Out: -  Intake/Output this shift: Total I/O In: 344.2 [Blood:344.2] Out: -   Lab Results:  Recent Labs  01/16/13 0027 01/16/13 0153  WBC 13.9*  --   HGB 4.4* 4.4*  HCT 16.7* 16.1*  PLT 27*  --    BMET  Recent Labs  01/16/13 0027  NA 136  K 3.0*  CL 102  CO2 22  GLUCOSE 110*  BUN 8  CREATININE 0.71  CALCIUM 9.0   LFT  Recent Labs  01/16/13 0353  PROT 6.3  ALBUMIN 3.2*  AST 15  ALT 10  ALKPHOS 78  BILITOT 0.4  BILIDIR 0.1  IBILI 0.3    Studies/Results: No results found.  IMPRESSION:  #38  52 year old female admitted with pyelonephritis #2 profound microcytic anemia-probably iron deficient #3 occult positive stool #4 Menorrhagia #5 profound thrombocytopenia   PLAN: Etiology of her anemia may be multifactorial however with Hemoccult-positive stools she will definitely need endoscopic evaluation with colonoscopy and EGD.  Her pyelonephritis is to be treated first and thrombocytopenia further evaluated as well. We will coordinate timing of endoscopic evaluation over the next few days Would prefer her platelet count to be over 35,004 considering any endoscopic intervention. Hematology consult Await iron studies Transfuse to hemoglobin of 7 Empiric PPI   Amy Esterwood  01/16/2013, 9:55 AM    GI ATTENDING  History, laboratories, x-rays reviewed. Patient personally seen and examined. Agree with history and physical as outlined above. The patient presents with acute pyelonephritis. Found to have marked microcytic anemia and thrombocytopenia. Stools Hemoccult-positive but no clinical evidence for melena or hematochezia. Hemodynamically stable. Physical exam noncontributory.  RECOMMENDATIONS 1. Treat pyelonephritis 2. Formal hematology evaluation to understand better  profound cytopenias 3. Empiric PPI for upper GI mucosal protection 4. Will need GI workup including colonoscopy and upper endoscopy. This will be done as an outpatient after she has recovered from pyelonephritis and the nature of her cytopenia is better define. We will arrange followup. We have discussed this with the patient. We are available for questions. Will sign off. Thank you  Wilhemina Bonito. Eda Keys., M.D. [facility] Division of Gastroenterology

## 2013-01-16 NOTE — ED Provider Notes (Signed)
CSN: [id number]     Arrival date & time 01/15/13  2040 History   First MD Initiated Contact with Patient 01/16/13 0025     Chief Complaint  Patient presents with  . Urinary Tract Infection   (Consider location/radiation/quality/duration/timing/severity/associated sxs/prior Treatment) HPI 52 year old female presents to emergency department from urgent care with concern for acute pyelonephritis.  Patient, reports she's had 2 days of suprapubic and bilateral lower back pain.  She initially thought she was coming onto her menstrual cycle.  She, reports she's had urinary frequency, and some burning upon urination.  Patient was seen this morning, diagnosed with UTI and given Keflex  Patient has been taking the antibiotics and Tylenol throughout the day.  She reports this afternoon she began to feel worse and had temps to 103.  She returned to urgent care, who was concerned about pyelonephritis, and was sent to the emergency room.  Patient denies previous urinary tract infections.  No significant past medical history.  She does not take any medication.  She denies any vaginal discharge or new sexual partners.  She has had no nausea or vomiting, and reports that she is actually hungry, and requests something to eat.  History reviewed. No pertinent past medical history. Past Surgical History  Procedure Laterality Date  . Tubal ligation Bilateral    Family History  Problem Relation Age of Onset  . Hypertension Mother   . Kidney failure Father    History  Substance Use Topics  . Smoking status: Current Some Day Smoker  . Smokeless tobacco: Not on file  . Alcohol Use: Yes     Comment: Socially   OB History   Grav Para Term Preterm Abortions TAB SAB Ect Mult Living                 Review of Systems  All other systems reviewed and are negative.   other than listed in history of present illness  Allergies  Review of patient's allergies indicates no known allergies.  Home Medications    Current Outpatient Rx  Name  Route  Sig  Dispense  Refill  . acetaminophen (TYLENOL) 325 MG tablet   Oral   Take 650 mg by mouth every 6 (six) hours as needed for pain.         . cephALEXin (KEFLEX) 500 MG capsule   Oral   Take 1 capsule (500 mg total) by mouth 3 (three) times daily.   21 capsule   0   . ibuprofen (ADVIL,MOTRIN) 200 MG tablet   Oral   Take 400 mg by mouth every 6 (six) hours as needed for pain.          BP 125/66  Pulse 106  Temp(Src) 99.7 F (37.6 C) (Oral)  Resp 20  Ht 5\' 6"  (1.676 m)  Wt 176 lb (79.833 kg)  BMI 28.42 kg/m2  SpO2 98%  LMP 12/20/2012 Physical Exam  Nursing note and vitals reviewed. Constitutional: She is oriented to person, place, and time. She appears well-developed and well-nourished.  HENT:  Head: Normocephalic and atraumatic.  Nose: Nose normal.  Mouth/Throat: Oropharynx is clear and moist.  Eyes: Conjunctivae and EOM are normal. Pupils are equal, round, and reactive to light.  Neck: Normal range of motion. Neck supple. No JVD present. No tracheal deviation present. No thyromegaly present.  Cardiovascular: Normal rate, regular rhythm, normal heart sounds and intact distal pulses.  Exam reveals no gallop and no friction rub.   No murmur heard. Pulmonary/Chest: Effort normal  and breath sounds normal. No stridor. No respiratory distress. She has no wheezes. She has no rales. She exhibits no tenderness.  Abdominal: Soft. Bowel sounds are normal. She exhibits no distension and no mass. There is no tenderness. There is no rebound and no guarding.  Patient reports no pain at this time after taking Tylenol given to her by triage  Musculoskeletal: Normal range of motion. She exhibits no edema and no tenderness.  Lymphadenopathy:    She has no cervical adenopathy.  Neurological: She is alert and oriented to person, place, and time. She exhibits normal muscle tone. Coordination normal.  Skin: Skin is warm and dry. No rash noted. No  erythema. No pallor.  Psychiatric: She has a normal mood and affect. Her behavior is normal. Judgment and thought content normal.    ED Course  Procedures (including critical care time) Labs Review Labs Reviewed  CBC WITH DIFFERENTIAL - Abnormal; Notable for the following:    WBC 13.9 (*)    RBC 2.29 (*)    Hemoglobin 4.4 (*)    HCT 16.7 (*)    MCV 72.9 (*)    MCH 19.2 (*)    MCHC 26.3 (*)    RDW 32.2 (*)    Platelets 27 (*)    Neutrophils Relative % 92 (*)    Lymphocytes Relative 4 (*)    Neutro Abs 12.7 (*)    Lymphs Abs 0.6 (*)    All other components within normal limits  BASIC METABOLIC PANEL - Abnormal; Notable for the following:    Potassium 3.0 (*)    Glucose, Bld 110 (*)    All other components within normal limits  HEMOGLOBIN AND HEMATOCRIT, BLOOD - Abnormal; Notable for the following:    Hemoglobin 4.4 (*)    HCT 16.1 (*)    All other components within normal limits  URINALYSIS, ROUTINE W REFLEX MICROSCOPIC  SAMPLE TO BLOOD BANK  PREPARE RBC (CROSSMATCH)   Imaging Review No results found.  MDM   1. Pyelonephritis   2. Anemia   3. Thrombocytopenia   4. Hypokalemia     52 year old female with probable pyelonephritis.  She overall looks well.  She has had no nausea or vomiting.  Patient has fever and tachycardia.  We'll plan to give IV fluids, IV Rocephin and Cipro, and reevaluate.  If her vital signs improve, and she continues to not have nausea and vomiting, she may be able to be discharged home.  2:48 AM Pt found to be severely anemic with thrombocytopenia.  She does report heavy periods.  Denies sob, fatigue.  She is tachycardic here, but unclear if this is due to anemia or acute infection.  Will plan to transfuse 2 units prbc, d/w hospitalist for admission.  Olivia Mackie, MD 01/16/13 [phone]

## 2013-01-17 ENCOUNTER — Inpatient Hospital Stay (HOSPITAL_COMMUNITY): Payer: BC Managed Care – PPO

## 2013-01-17 DIAGNOSIS — D751 Secondary polycythemia: Secondary | ICD-10-CM

## 2013-01-17 DIAGNOSIS — D6959 Other secondary thrombocytopenia: Secondary | ICD-10-CM

## 2013-01-17 DIAGNOSIS — E876 Hypokalemia: Secondary | ICD-10-CM

## 2013-01-17 DIAGNOSIS — N390 Urinary tract infection, site not specified: Secondary | ICD-10-CM

## 2013-01-17 DIAGNOSIS — N920 Excessive and frequent menstruation with regular cycle: Secondary | ICD-10-CM

## 2013-01-17 LAB — TYPE AND SCREEN

## 2013-01-17 LAB — PREPARE PLATELET PHERESIS: Unit division: 0

## 2013-01-17 LAB — URINE CULTURE: Culture: NO GROWTH

## 2013-01-17 LAB — COMPREHENSIVE METABOLIC PANEL
ALT: 13 U/L (ref 0–35)
AST: 19 U/L (ref 0–37)
Albumin: 3.1 g/dL — ABNORMAL LOW (ref 3.5–5.2)
CO2: 23 mEq/L (ref 19–32)
Calcium: 9.1 mg/dL (ref 8.4–10.5)
Creatinine, Ser: 0.57 mg/dL (ref 0.50–1.10)
GFR calc non Af Amer: >90 mL/min (ref >90)
Sodium: 132 mEq/L — ABNORMAL LOW (ref 135–145)
Total Protein: 6.4 g/dL (ref 6.0–8.3)

## 2013-01-17 LAB — CBC
HCT: 23.2 % — ABNORMAL LOW (ref 36.0–46.0)
Hemoglobin: 6.8 g/dL — CL (ref 12.0–15.0)
MCH: 23.1 pg — ABNORMAL LOW (ref 26.0–34.0)
RBC: 2.95 MIL/uL — ABNORMAL LOW (ref 3.87–5.11)

## 2013-01-17 MED ORDER — POTASSIUM CHLORIDE CRYS ER 20 MEQ PO TBCR
40.0000 meq | EXTENDED_RELEASE_TABLET | Freq: Once | ORAL | Status: AC
  Administered 2013-01-17: 40 meq via ORAL
  Filled 2013-01-17: qty 2

## 2013-01-17 MED ORDER — SODIUM CHLORIDE 0.9 % IV SOLN
25.0000 mg | Freq: Once | INTRAVENOUS | Status: AC
  Administered 2013-01-17: 25 mg via INTRAVENOUS
  Filled 2013-01-17: qty 0.5

## 2013-01-17 MED ORDER — OXYCODONE-ACETAMINOPHEN 5-325 MG PO TABS
1.0000 | ORAL_TABLET | ORAL | Status: DC | PRN
  Administered 2013-01-17 – 2013-01-18 (×3): 2 via ORAL
  Administered 2013-01-19: 1 via ORAL
  Filled 2013-01-17: qty 2
  Filled 2013-01-17: qty 1
  Filled 2013-01-17 (×2): qty 2

## 2013-01-17 MED ORDER — FOLIC ACID 1 MG PO TABS
1.0000 mg | ORAL_TABLET | Freq: Every day | ORAL | Status: DC
  Administered 2013-01-17 – 2013-01-18 (×2): 1 mg via ORAL
  Filled 2013-01-17 (×3): qty 1

## 2013-01-17 MED ORDER — SODIUM CHLORIDE 0.9 % IV SOLN
1000.0000 mg | Freq: Once | INTRAVENOUS | Status: AC
  Administered 2013-01-17: 1000 mg via INTRAVENOUS
  Filled 2013-01-17 (×2): qty 20

## 2013-01-17 MED ORDER — PANTOPRAZOLE SODIUM 40 MG PO TBEC
40.0000 mg | DELAYED_RELEASE_TABLET | Freq: Every day | ORAL | Status: DC
  Administered 2013-01-17 – 2013-01-19 (×3): 40 mg via ORAL
  Filled 2013-01-17 (×3): qty 1

## 2013-01-17 MED ORDER — FERROUS GLUCONATE 324 (38 FE) MG PO TABS
324.0000 mg | ORAL_TABLET | Freq: Two times a day (BID) | ORAL | Status: DC
  Administered 2013-01-18 – 2013-01-19 (×3): 324 mg via ORAL
  Filled 2013-01-17 (×5): qty 1

## 2013-01-17 NOTE — Progress Notes (Signed)
Deborah Cabrera is a 52 y.o. female patient who transferred  from [facility] awake, alert  & orientated  X 3, Full Code, VSS - Blood pressure 118/77, pulse 82, temperature 100.4 F (38 C), temperature source Oral, resp. rate 18, height 5\' 6"  (1.676 m), weight 87.181 kg (192 lb 3.2 oz), last menstrual period 12/20/2012, SpO2 95.00%., r, no c/o shortness of breath, no c/o chest pain, no distress noted. Tele # [id number] placed and pt is currently running:normal sinus rhythm.   IV site WDL: forearm right and left, condition patent and no redness with a transparent dsg that's clean dry and intact.  Allergies:  No Known Allergies   History reviewed. No pertinent past medical history.  Pt orientation to unit, room and routine. SR up x 2, fall risk assessment complete with Patient and family verbalizing understanding of risks associated with falls. Pt verbalizes an understanding of how to use the call bell and to call for help before getting out of bed.  Skin, clean-dry- intact without evidence of bruising, or skin tears.   No evidence of skin break down noted on exam.     Will cont to monitor and assist as needed.  Cindra Eves, RN 01/17/2013 7:00 PM

## 2013-01-17 NOTE — Progress Notes (Signed)
CRITICAL VALUE ALERT  Critical value received:  Hemoglobin 6.8   Date of notification:  01/17/13   Time of notification:  06:59  Critical value read back:yes  Nurse who received alert:  Barnie Del RN  MD notified (1st page):  Rai  Time of first page:  0700  MD notified (2nd page):  Time of second page:  Responding MD:    Time MD responded:

## 2013-01-17 NOTE — Progress Notes (Signed)
Patient ID: Deborah Cabrera  female  ZOX:[id number]    DOB: 03/05/1961    DOA: 01/16/2013  PCP: No PCP Per Patient  Assessment/Plan: Principal Problem:  SEVERE Anemia, acute on chronic: with severe iron deficiency and folic acid deficiency. Possibly from chronic Metro/menorrhagia from uterine fibroids - Hemoglobin 6.8 today, platelets 45K, transfused 2 units of packed RBCs in the unit of platelets yesterday - LDH, haptoglobin within normal limits,  - Iron panel showed ferritin of 12, and sat ratio 6, folate 3.2. Will get one dose of IV venofer, start folic acid 1mg  daily. - Start oral Fe replacement - CT abdomen did not show any splenomegaly or any abdominal pathology. - Pelvic and transvaginal ultrasound showed several small uterine fibroids 2 of which are submucosal likely the cause of Metro/menorrhagia. Patient will make OB/GYN appointment after dc. - Appreciate GI and hematology input. - HIV neg, hepatitis panel pending - FOBT negative, out-pt GI f/u PRN  Active Problems: Severe Thrombocytopenia: ? Cause, likely secondary to myelofibrosis versus drug-induced ? ITP.  - Platelets improved to 45 K., no active bleeding   Pyelonephritis Escherichia coli UTI:  - Continue IV Rocephin. Change to oral antibiotics for 2 weeks at the time of  DC   Hypokalemia: Replaced  DVT Prophylaxis: SCD's  Code Status: FC  Disposition: transfer to tele floor   Subjective: Patient denies any specific complaints at this time, no active bleeding. No acute issues overnight   Objective: Weight change: 7.348 kg (16 lb 3.2 oz)  Intake/Output Summary (Last 24 hours) at 01/17/13 0846 Last data filed at 01/16/13 2325  Gross per 24 hour  Intake 2173.33 ml  Output   1252 ml  Net 921.33 ml   Blood pressure 148/83, pulse 91, temperature 99.9 F (37.7 C), temperature source Oral, resp. rate 21, height 5\' 6"  (1.676 m), weight 87.181 kg (192 lb 3.2 oz), last menstrual period 12/20/2012, SpO2  99.00%.  Physical Exam: General: Alert and awake, oriented x3, not in any acute distress. CVS: S1-S2 clear, no murmur rubs or gallops Chest: CTAB Abdomen: soft NT, ND, NBS Extremities: no c/c/e bilaterally   Lab Results: Basic Metabolic Panel:  Recent Labs Lab 01/16/13 1557 01/17/13 0522  NA 139 132*  K 3.5 3.0*  CL 104 98  CO2 19 23  GLUCOSE 87 84  BUN 6 4*  CREATININE 0.61 0.57  CALCIUM 9.2 9.1   Liver Function Tests:  Recent Labs Lab 01/16/13 0353 01/17/13 0522  AST 15 19  ALT 10 13  ALKPHOS 78 PENDING  BILITOT 0.4 0.7  PROT 6.3 6.4  ALBUMIN 3.2* 3.1*   No results found for this basename: LIPASE, AMYLASE,  in the last 168 hours No results found for this basename: AMMONIA,  in the last 168 hours CBC:  Recent Labs Lab 01/16/13 1556 01/17/13 0522  WBC 9.6 7.6  NEUTROABS 8.4*  --   HGB 7.1* 6.8*  HCT 24.3* 23.2*  MCV 78.1 78.6  PLT 42* 45*   Cardiac Enzymes:  Recent Labs Lab 01/16/13 1606  TROPONINI <0.30   BNP: No components found with this basename: POCBNP,  CBG: No results found for this basename: GLUCAP,  in the last 168 hours   Micro Results: Recent Results (from the past 240 hour(s))  URINE CULTURE     Status: None   Collection Time    01/15/13 11:54 AM      Result Value Range Status   Specimen Description URINE, CLEAN CATCH   Final  Special Requests NONE   Final   Culture  Setup Time     Final   Value: 01/15/2013 16:15     Performed at [facility] Count     Final   Value: >=100,000 COLONIES/ML     Performed at [facility]   Culture     Final   Value: ESCHERICHIA COLI     Performed at [facility]   Report Status 01/17/2013 FINAL   Final   Organism ID, Bacteria ESCHERICHIA COLI   Final  MRSA PCR SCREENING     Status: None   Collection Time    01/16/13  4:28 AM      Result Value Range Status   MRSA by PCR NEGATIVE  NEGATIVE Final   Comment:            The GeneXpert MRSA Assay (FDA      approved for NASAL specimens     only), is one component of a     comprehensive MRSA colonization     surveillance program. It is not     intended to diagnose MRSA     infection nor to guide or     monitor treatment for     MRSA infections.    Studies/Results: No results found.  Medications: Scheduled Meds: . cefTRIAXone (ROCEPHIN)  IV  1 g Intravenous Q24H  . [START ON 01/18/2013] ferrous gluconate  324 mg Oral BID WC  . folic acid  1 mg Oral Daily  . iron dextran (INFED/DEXFERRUM) infusion  25 mg Intravenous Once   Followed by  . iron dextran (INFED/DEXFERRUM) infusion  1,000 mg Intravenous Once  . pantoprazole  40 mg Oral Q0600  . potassium chloride  40 mEq Oral Once  . sodium chloride  3 mL Intravenous Q12H      LOS: 1 day   Enmanuel Zufall M.D. [facility] 01/17/2013, 8:46 AM Pager: [phone]  If 7PM-7AM, please contact night-coverage [email or web address] Password [email or web address]
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
## 2013-01-17 NOTE — ED Notes (Signed)
Urine culture: >100,000 colonies E. Coli.  Pt. adequately treated with Keflex, but came back to [facility], due to feeling worse and increased fever. Pt. was transferred to ED for further treatment. Deborah Cabrera 01/17/2013

## 2013-01-18 DIAGNOSIS — A498 Other bacterial infections of unspecified site: Secondary | ICD-10-CM

## 2013-01-18 LAB — CBC
MCV: 80.8 fL (ref 78.0–100.0)
Platelets: 68 10*3/uL — ABNORMAL LOW (ref 150–400)
RBC: 3.17 MIL/uL — ABNORMAL LOW (ref 3.87–5.11)
RDW: 29.5 % — ABNORMAL HIGH (ref 11.5–15.5)
WBC: 7.1 10*3/uL (ref 4.0–10.5)

## 2013-01-18 LAB — APTT: aPTT: 44 seconds — ABNORMAL HIGH (ref 24–37)

## 2013-01-18 LAB — PROTIME-INR
INR: 1.14 (ref 0.00–1.49)
Prothrombin Time: 14.4 seconds (ref 11.6–15.2)

## 2013-01-18 MED ORDER — CIPROFLOXACIN HCL 500 MG PO TABS
500.0000 mg | ORAL_TABLET | Freq: Two times a day (BID) | ORAL | Status: DC
  Administered 2013-01-18 – 2013-01-19 (×3): 500 mg via ORAL
  Filled 2013-01-18 (×5): qty 1

## 2013-01-18 NOTE — Progress Notes (Deleted)
[name] to be D/C'd Home with [facility] per MD order.  Discussed with the patient and all questions fully answered.    Medication List    ASK your doctor about these medications       acetaminophen 325 MG tablet  Commonly known as:  TYLENOL  Take 650 mg by mouth every 6 (six) hours as needed for pain.     cephALEXin 500 MG capsule  Commonly known as:  KEFLEX  Take 1 capsule (500 mg total) by mouth 3 (three) times daily.     ibuprofen 200 MG tablet  Commonly known as:  ADVIL,MOTRIN  Take 400 mg by mouth every 6 (six) hours as needed for pain.        VVS, Skin clean, dry and intact without evidence of skin break down, no evidence of skin tears noted. IV catheter discontinued intact. Site without signs and symptoms of complications. Dressing and pressure applied.  An After Visit Summary was printed and given to the patient. Follow up appointments , new prescriptions and medication administration times given to pt and daughter. Instructions given on diet and feeding instructions for pt and teach back given to daughter/caregiver. Patient escorted via WC, and D/C home via private auto.  Cindra Eves, RN 01/18/2013 2:53 PM

## 2013-01-18 NOTE — Progress Notes (Signed)
IP PROGRESS NOTE  Subjective:   No bleeding.  Objective: Vital signs in last 24 hours: Blood pressure 147/81, pulse 78, temperature 99.5 F (37.5 C), temperature source Oral, resp. rate 17, height 5\' 6"  (1.676 m), weight 192 lb 3.2 oz (87.181 kg), last menstrual period 12/20/2012, SpO2 98.00%.  Intake/Output from previous day: 09/13 0701 - 09/14 0700 In: 520 [IV Piggyback:520] Out: -   Physical Exam:  HEENT: Mouth without bleeding Abdomen: No hepatosplenomegaly Skin: No ecchymoses   Portacath/PICC-without erythema  Lab Results:  Recent Labs  01/17/13 0522 01/18/13 0500  WBC 7.6 7.1  HGB 6.8* 7.4*  HCT 23.2* 25.6*  PLT 45* 68*    BMET  Recent Labs  01/16/13 1557 01/17/13 0522  NA 139 132*  K 3.5 3.0*  CL 104 98  CO2 19 23  GLUCOSE 87 84  BUN 6 4*  CREATININE 0.61 0.57  CALCIUM 9.2 9.1    Studies/Results: Ct Head Wo Contrast  01/17/2013   CLINICAL DATA:  52 year old female with severe headache. Pain. Anemia with thrombocytopenia.  EXAM: CT HEAD WITHOUT CONTRAST  TECHNIQUE: Contiguous axial images were obtained from the base of the skull through the vertex without intravenous contrast.  COMPARISON:  07/27/2005.  FINDINGS: Small left maxillary sinus mucous retention cysts partially visible. Other Visualized paranasal sinuses and mastoids are clear. No acute osseous abnormality identified. Visualized orbit soft tissues are within normal limits. Visualized scalp soft tissues are within normal limits.  Cerebral volume is normal. No midline shift, ventriculomegaly, mass effect, evidence of mass lesion, intracranial hemorrhage or evidence of cortically based acute infarction. Gray-white matter differentiation is within normal limits throughout the brain. No suspicious intracranial vascular hyperdensity.  IMPRESSION: Stable and normal noncontrast CT appearance of the brain.   Electronically Signed   By: Augusto Gamble M.D.   On: 01/17/2013 12:53   US Transvaginal  Non-ob  01/16/2013   CLINICAL DATA:  Menorrhagia.  EXAM: TRANSABDOMINAL AND TRANSVAGINAL ULTRASOUND OF PELVIS  TECHNIQUE: Both transabdominal and transvaginal ultrasound examinations of the pelvis were performed. Transabdominal technique was performed for global imaging of the pelvis including uterus, ovaries, adnexal regions, and pelvic cul-de-sac. It was necessary to proceed with endovaginal exam following the transabdominal exam to visualize the endometrium and ovaries.  COMPARISON:  None  FINDINGS: Uterus  Measurements: 7.0 x 4.1 x 6.1 cm. Diffusely heterogeneous myometrial echotexture. 2 fibroids are seen in the lower uterine segment and posterior corpus which have submucosal components impinging upon the endometrial cavity. These measure 2.6 cm and 2.5 cm in maximum diameters respectively. A small posterior subserosal fibroid is seen which measures 1.0 cm.  Endometrium  Thickness: 5 mm excluding small amount of fluid seen in endometrial cavity.  Right ovary  Measurements: 2.1 x 1.2 x 1.8 cm. Normal appearance/no adnexal mass.  Left ovary  Measurements: 1.6 x 1.0 x 1.6 cm. Normal appearance/no adnexal mass.  Other findings  A small amount of free fluid is seen.  IMPRESSION: Several small uterine fibroids, 2 of which are submucosal in location.  Unremarkable ovaries. No adnexal mass identified.   Electronically Signed   By: Myles Rosenthal   On: 01/16/2013 15:58   US Pelvis Complete  01/16/2013   CLINICAL DATA:  Menorrhagia.  EXAM: TRANSABDOMINAL AND TRANSVAGINAL ULTRASOUND OF PELVIS  TECHNIQUE: Both transabdominal and transvaginal ultrasound examinations of the pelvis were performed. Transabdominal technique was performed for global imaging of the pelvis including uterus, ovaries, adnexal regions, and pelvic cul-de-sac. It was necessary to proceed with endovaginal  exam following the transabdominal exam to visualize the endometrium and ovaries.  COMPARISON:  None  FINDINGS: Uterus  Measurements: 7.0 x 4.1 x 6.1  cm. Diffusely heterogeneous myometrial echotexture. 2 fibroids are seen in the lower uterine segment and posterior corpus which have submucosal components impinging upon the endometrial cavity. These measure 2.6 cm and 2.5 cm in maximum diameters respectively. A small posterior subserosal fibroid is seen which measures 1.0 cm.  Endometrium  Thickness: 5 mm excluding small amount of fluid seen in endometrial cavity.  Right ovary  Measurements: 2.1 x 1.2 x 1.8 cm. Normal appearance/no adnexal mass.  Left ovary  Measurements: 1.6 x 1.0 x 1.6 cm. Normal appearance/no adnexal mass.  Other findings  A small amount of free fluid is seen.  IMPRESSION: Several small uterine fibroids, 2 of which are submucosal in location.  Unremarkable ovaries. No adnexal mass identified.   Electronically Signed   By: Myles Rosenthal   On: 01/16/2013 15:58   Ct Abdomen Pelvis W Contrast  01/16/2013   CLINICAL DATA:  Anemia. Evaluate for mass. Right flank pain.  EXAM: CT ABDOMEN AND PELVIS WITH CONTRAST  TECHNIQUE: Multidetector CT imaging of the abdomen and pelvis was performed using the standard protocol following bolus administration of intravenous contrast.  CONTRAST:  80mL OMNIPAQUE IOHEXOL 300 MG/ML  SOLN  COMPARISON:  None.  FINDINGS: BODY WALL: Unremarkable.  LOWER CHEST:  Mediastinum: Trace pericardial fluid.  Lungs/pleura: No consolidation.  ABDOMEN/PELVIS:  Liver: No focal abnormality.  Biliary: No evidence of biliary obstruction or stone.  Pancreas: Unremarkable.  Spleen: Unremarkable.  Adrenals: Unremarkable.  Kidneys and ureters: Best seen on the coronal projection, there is patchy hypo enhancement of the right renal cortex, most notably affecting the lower pole. No urinary obstruction or stone. No abscess. Bladder: Circumferential mural thickening.  Bowel: No obstruction. Normal appendix.  Retroperitoneum: No mass or adenopathy.  Peritoneum: Small volume free fluid in the low pelvis.  Reproductive: Small uterine fibroids better  seen by recent pelvic sonography.  Vascular: No acute abnormality.  OSSEOUS: Thoracolumbar levoscoliosis with accelerated degenerative disc and facet disease. 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 Bilateral hip osteoarthritis.  IMPRESSION: Findings suggestive of urinary tract infection, both cystitis and right pyelonephritis. No urinary obstruction or calculus.   Electronically Signed   By: Tiburcio Pea   On: 01/16/2013 21:08   Dg Chest Port 1 View  01/16/2013   CLINICAL DATA:  Chest pain and shortness of Breath.  EXAM: PORTABLE CHEST - 1 VIEW  COMPARISON:  07/27/2005.  FINDINGS: The heart size and mediastinal contours are within normal limits given the AP projection. Both lungs are clear. The visualized skeletal structures are unremarkable.  IMPRESSION: No active disease.   Electronically Signed   By: Loralie Champagne M.D.   On: 01/16/2013 17:44    Medications: I have reviewed the patient's current medications.  Assessment/Plan:  1. Iron deficiency anemia -workup/management per primary M.D.  2. Thrombocytopenia-improved  3. Escherichia coli urinary tract infection  The thrombocytopenia has improved. The thrombocytopenia is most likely secondary to the urinary tract infection.  Recommendations:  1. Iron replacement 2. Treatment of urinary tract infection per medical team 3. no indication for a platelet transfusion or steroids at present   LOS: 2 days   [facility], Keeana Pieratt  01/18/2013, 8:23 AM

## 2013-01-18 NOTE — Progress Notes (Signed)
Patient ID: Deborah Cabrera  female  FAO:[id number]    DOB: 31-May-1960    DOA: 01/16/2013  PCP: No PCP Per Patient  Assessment/Plan: Principal Problem:  SEVERE Anemia, acute on chronic: with severe iron deficiency and folic acid deficiency. Possibly from chronic Metro/menorrhagia from uterine fibroids - Hemoglobin 7.4 with platelets of 68 K after transfusion of 2 units packed RBC, 1 unit platelets on 9/12 - LDH, haptoglobin within normal limits,  - Iron panel showed ferritin of 12, and sat ratio 6, folate 3.2, status post IV venofer on 9/13, continue folic acid and iron replacement  - CT abdomen did not show any splenomegaly or any abdominal pathology. - Pelvic and transvaginal ultrasound showed several small uterine fibroids 2 of which are submucosal likely the cause of Metro/menorrhagia. Patient will make OB/GYN appointment after dc. - Appreciate GI and hematology input. - HIV neg, hepatitis panel negative so far - FOBT negative, out-pt GI f/u PRN  Severe Thrombocytopenia: ? Cause, likely secondary to myelofibrosis versus drug-induced ? ITP.  - Platelets improving   Pyelonephritis Escherichia coli UTI:  - Continuing to spike fevers, will DC IV Rocephin. Change to oral ciprofloxacin for 2 weeks  DVT Prophylaxis: SCD's  Code Status: FC  Disposition: If stable, DC home in a.m.   Subjective: Patient denies any specific complaints at this time, no active bleeding. No acute issues overnight   Objective: Weight change:   Intake/Output Summary (Last 24 hours) at 01/18/13 1448 Last data filed at 01/18/13 0100  Gross per 24 hour  Intake    520 ml  Output      0 ml  Net    520 ml   Blood pressure 134/81, pulse 71, temperature 98.4 F (36.9 C), temperature source Oral, resp. rate 18, height 5\' 6"  (1.676 m), weight 87.181 kg (192 lb 3.2 oz), last menstrual period 12/20/2012, SpO2 97.00%.  Physical Exam: General: A x O x 3, NAD CVS: S1-S2 clear, no murmur rubs or gallops Chest:  CTAB Abdomen: soft NT, ND, NBS Extremities: no c/c/e bilaterally   Lab Results: Basic Metabolic Panel:  Recent Labs Lab 01/16/13 1557 01/17/13 0522  NA 139 132*  K 3.5 3.0*  CL 104 98  CO2 19 23  GLUCOSE 87 84  BUN 6 4*  CREATININE 0.61 0.57  CALCIUM 9.2 9.1   Liver Function Tests:  Recent Labs Lab 01/16/13 0353 01/17/13 0522  AST 15 19  ALT 10 13  ALKPHOS 78 79  BILITOT 0.4 0.7  PROT 6.3 6.4  ALBUMIN 3.2* 3.1*   No results found for this basename: LIPASE, AMYLASE,  in the last 168 hours No results found for this basename: AMMONIA,  in the last 168 hours CBC:  Recent Labs Lab 01/16/13 1556 01/17/13 0522 01/18/13 0500  WBC 9.6 7.6 7.1  NEUTROABS 8.4*  --   --   HGB 7.1* 6.8* 7.4*  HCT 24.3* 23.2* 25.6*  MCV 78.1 78.6 80.8  PLT 42* 45* 68*   Cardiac Enzymes:  Recent Labs Lab 01/16/13 1606  TROPONINI <0.30   BNP: No components found with this basename: POCBNP,  CBG: No results found for this basename: GLUCAP,  in the last 168 hours   Micro Results: Recent Results (from the past 240 hour(s))  URINE CULTURE     Status: None   Collection Time    01/15/13 11:54 AM      Result Value Range Status   Specimen Description URINE, CLEAN CATCH   Final   Special  Requests NONE   Final   Culture  Setup Time     Final   Value: 01/15/2013 16:15     Performed at [facility] Count     Final   Value: >=100,000 COLONIES/ML     Performed at [facility]   Culture     Final   Value: ESCHERICHIA COLI     Performed at [facility]   Report Status 01/17/2013 FINAL   Final   Organism ID, Bacteria ESCHERICHIA COLI   Final  CULTURE, BLOOD (ROUTINE X 2)     Status: None   Collection Time    01/16/13  3:35 AM      Result Value Range Status   Specimen Description BLOOD RIGHT ANTECUBITAL   Final   Special Requests BOTTLES DRAWN AEROBIC AND ANAEROBIC 10CC   Final   Culture  Setup Time     Final   Value: 01/16/2013 11:37      Performed at [facility]   Culture     Final   Value:        BLOOD CULTURE RECEIVED NO GROWTH TO DATE CULTURE WILL BE HELD FOR 5 DAYS BEFORE ISSUING A FINAL NEGATIVE REPORT     Performed at [facility]   Report Status PENDING   Incomplete  CULTURE, BLOOD (ROUTINE X 2)     Status: None   Collection Time    01/16/13  3:50 AM      Result Value Range Status   Specimen Description BLOOD RIGHT HAND   Final   Special Requests BOTTLES DRAWN AEROBIC AND ANAEROBIC 10CC   Final   Culture  Setup Time     Final   Value: 01/16/2013 11:37     Performed at [facility]   Culture     Final   Value:        BLOOD CULTURE RECEIVED NO GROWTH TO DATE CULTURE WILL BE HELD FOR 5 DAYS BEFORE ISSUING A FINAL NEGATIVE REPORT     Performed at [facility]   Report Status PENDING   Incomplete  MRSA PCR SCREENING     Status: None   Collection Time    01/16/13  4:28 AM      Result Value Range Status   MRSA by PCR NEGATIVE  NEGATIVE Final   Comment:            The GeneXpert MRSA Assay (FDA     approved for NASAL specimens     only), is one component of a     comprehensive MRSA colonization     surveillance program. It is not     intended to diagnose MRSA     infection nor to guide or     monitor treatment for     MRSA infections.  URINE CULTURE     Status: None   Collection Time    01/16/13  9:02 AM      Result Value Range Status   Specimen Description URINE, CLEAN CATCH   Final   Special Requests NONE   Final   Culture  Setup Time     Final   Value: 01/16/2013 11:43     Performed at [facility] Count     Final   Value: NO GROWTH     Performed at [facility]   Culture     Final   Value: NO GROWTH     Performed  at [facility]   Report Status 01/17/2013 FINAL   Final    Studies/Results: No results found.  Medications: Scheduled Meds: . ciprofloxacin  500 mg Oral BID  . ferrous gluconate  324 mg Oral BID WC  . folic acid  1  mg Oral Daily  . pantoprazole  40 mg Oral Q0600  . sodium chloride  3 mL Intravenous Q12H      LOS: 2 days   Deborah Cabrera M.D. [facility] 01/18/2013, 2:48 PM Pager: [phone]  If 7PM-7AM, please contact night-coverage [email or web address] Password [email or web address]

## 2013-01-19 ENCOUNTER — Telehealth: Payer: Self-pay | Admitting: General Practice

## 2013-01-19 DIAGNOSIS — D259 Leiomyoma of uterus, unspecified: Secondary | ICD-10-CM

## 2013-01-19 LAB — CBC
HCT: 24.9 % — ABNORMAL LOW (ref 36.0–46.0)
Hemoglobin: 7.1 g/dL — ABNORMAL LOW (ref 12.0–15.0)
RBC: 3.02 MIL/uL — ABNORMAL LOW (ref 3.87–5.11)
WBC: 8.5 10*3/uL (ref 4.0–10.5)

## 2013-01-19 LAB — HEPATITIS PANEL, ACUTE
Hep B C IgM: NEGATIVE
Hepatitis B Surface Ag: NEGATIVE

## 2013-01-19 LAB — THROMBIN TIME: Thrombin Time: 13.6 seconds (ref <21.0)

## 2013-01-19 MED ORDER — FERROUS GLUCONATE 324 (38 FE) MG PO TABS
324.0000 mg | ORAL_TABLET | Freq: Three times a day (TID) | ORAL | Status: DC
  Filled 2013-01-19 (×3): qty 1

## 2013-01-19 MED ORDER — FOLIC ACID 1 MG PO TABS: 1.0000 mg | ORAL_TABLET | Freq: Every day | ORAL | Status: DC

## 2013-01-19 MED ORDER — CIPROFLOXACIN HCL 500 MG PO TABS: 500.0000 mg | ORAL_TABLET | Freq: Two times a day (BID) | ORAL | Status: DC

## 2013-01-19 MED ORDER — PANTOPRAZOLE SODIUM 40 MG PO TBEC: 40.0000 mg | DELAYED_RELEASE_TABLET | Freq: Every day | ORAL | Status: DC

## 2013-01-19 MED ORDER — FERROUS GLUCONATE 324 (38 FE) MG PO TABS: 324.0000 mg | ORAL_TABLET | Freq: Three times a day (TID) | ORAL | Status: DC

## 2013-01-19 NOTE — Progress Notes (Signed)
NURSING PROGRESS NOTE  JAMEAH ROUSER [id number] Discharge Data: 01/19/2013 12:01 PM Attending Provider: Cathren Harsh, MD PCP:No PCP Per Patient     [name] to be D/C'd Home per MD order.  Discussed with the patient the After Visit Summary and all questions fully answered. All IV's discontinued with no bleeding noted. All belongings returned to patient for patient to take home.   Last Vital Signs:  Blood pressure 155/88, pulse 66, temperature 98.5 F (36.9 C), temperature source Oral, resp. rate 17, height 5\' 6"  (1.676 m), weight 87.181 kg (192 lb 3.2 oz), last menstrual period 12/20/2012, SpO2 97.00%.  Discharge Medication List   Medication List    STOP taking these medications       cephALEXin 500 MG capsule  Commonly known as:  KEFLEX     ibuprofen 200 MG tablet  Commonly known as:  ADVIL,MOTRIN      TAKE these medications       acetaminophen 325 MG tablet  Commonly known as:  TYLENOL  Take 650 mg by mouth every 6 (six) hours as needed for pain.     ciprofloxacin 500 MG tablet  Commonly known as:  CIPRO  Take 1 tablet (500 mg total) by mouth 2 (two) times daily. X 11 more days     ferrous gluconate 324 MG tablet  Commonly known as:  FERGON  Take 1 tablet (324 mg total) by mouth 3 (three) times daily with meals.     folic acid 1 MG tablet  Commonly known as:  FOLVITE  Take 1 tablet (1 mg total) by mouth daily.     pantoprazole 40 MG tablet  Commonly known as:  PROTONIX  Take 1 tablet (40 mg total) by mouth daily. For heart burn           Patient is waiting on her sister to pick her up.

## 2013-01-19 NOTE — Telephone Encounter (Signed)
The patient's physician in the hospital called hoping to get the patient scheduled as a new patient after hospital discharge.  I explained your next new patient openings were a ways out, and the doctor asked me to send a msg to see if this patient can be worked in within the next couple weeks.  I apologize, I ended the call before getting the physician's name.  Please advise if you want this patient worked in.  Thanks!    The patient's callback - [phone]
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
## 2013-01-19 NOTE — Telephone Encounter (Signed)
Ok to work in- thanks 

## 2013-01-20 NOTE — Telephone Encounter (Signed)
Patient called back to schedule

## 2013-01-20 NOTE — Telephone Encounter (Signed)
Called pt to sch, lvmom.

## 2013-01-22 LAB — CULTURE, BLOOD (ROUTINE X 2)

## 2013-01-27 LAB — HM PAP SMEAR

## 2013-01-30 ENCOUNTER — Ambulatory Visit (INDEPENDENT_AMBULATORY_CARE_PROVIDER_SITE_OTHER): Payer: BC Managed Care – PPO | Admitting: Internal Medicine

## 2013-01-30 ENCOUNTER — Other Ambulatory Visit (INDEPENDENT_AMBULATORY_CARE_PROVIDER_SITE_OTHER): Payer: BC Managed Care – PPO

## 2013-01-30 ENCOUNTER — Encounter: Payer: Self-pay | Admitting: Internal Medicine

## 2013-01-30 VITALS — BP 128/84 | HR 87 | Temp 98.2°F | Ht 66.0 in | Wt 177.8 lb

## 2013-01-30 DIAGNOSIS — Z0000 Encounter for general adult medical examination without abnormal findings: Secondary | ICD-10-CM

## 2013-01-30 DIAGNOSIS — D696 Thrombocytopenia, unspecified: Secondary | ICD-10-CM

## 2013-01-30 DIAGNOSIS — N12 Tubulo-interstitial nephritis, not specified as acute or chronic: Secondary | ICD-10-CM

## 2013-01-30 DIAGNOSIS — D649 Anemia, unspecified: Secondary | ICD-10-CM

## 2013-01-30 DIAGNOSIS — M1710 Unilateral primary osteoarthritis, unspecified knee: Secondary | ICD-10-CM

## 2013-01-30 DIAGNOSIS — Z1211 Encounter for screening for malignant neoplasm of colon: Secondary | ICD-10-CM

## 2013-01-30 LAB — CBC WITH DIFFERENTIAL/PLATELET
Basophils Absolute: 0 10*3/uL (ref 0.0–0.1)
Basophils Relative: 0.1 % (ref 0.0–3.0)
Eosinophils Absolute: 0.1 10*3/uL (ref 0.0–0.7)
Lymphocytes Relative: 17.3 % (ref 12.0–46.0)
MCHC: 30.2 g/dL (ref 30.0–36.0)
MCV: 84.4 fl (ref 78.0–100.0)
Monocytes Absolute: 0.5 10*3/uL (ref 0.1–1.0)
Neutro Abs: 4.9 10*3/uL (ref 1.4–7.7)
Neutrophils Relative %: 73.1 % (ref 43.0–77.0)
RBC: 4.51 Mil/uL (ref 3.87–5.11)
RDW: 31.7 % — ABNORMAL HIGH (ref 11.5–14.6)

## 2013-01-30 LAB — TSH: TSH: 0.94 u[IU]/mL (ref 0.35–5.50)

## 2013-01-30 LAB — URINALYSIS, ROUTINE W REFLEX MICROSCOPIC
Bilirubin Urine: NEGATIVE
Ketones, ur: NEGATIVE
Nitrite: NEGATIVE
Urobilinogen, UA: 0.2 (ref 0.0–1.0)

## 2013-01-30 LAB — LIPID PANEL
Cholesterol: 134 mg/dL (ref 0–200)
HDL: 65 mg/dL (ref >39.00)
LDL Cholesterol: 45 mg/dL (ref 0–99)
Triglycerides: 121 mg/dL (ref 0.0–149.0)
VLDL: 24.2 mg/dL (ref 0.0–40.0)

## 2013-01-30 LAB — BASIC METABOLIC PANEL
BUN: 11 mg/dL (ref 6–23)
Chloride: 107 mEq/L (ref 96–112)
Creatinine, Ser: 0.7 mg/dL (ref 0.4–1.2)
GFR: 106.55 mL/min (ref >60.00)
Potassium: 4.1 mEq/L (ref 3.5–5.1)

## 2013-01-30 MED ORDER — TRAMADOL HCL 50 MG PO TABS: 50.0000 mg | ORAL_TABLET | Freq: Three times a day (TID) | ORAL | Status: DC | PRN

## 2013-01-30 NOTE — Progress Notes (Signed)
Subjective:    Patient ID: Deborah Cabrera, female    DOB: 10/21/1960, 52 y.o.   MRN: [id number]  HPI New Patient to me and our practice, here to establish a primary care provider  Also patient is here today for annual physical. Patient feels well and has no complaints.   Recent hospital stay - discharge instruction and follow up reviewed: Discharge date: 01/19/2013  Primary Care Physician: No PCP Per Patient  Discharge Diagnoses:   . severe Anemia . Cystitis with right-sided Pyelonephritis . Thrombocytopenia . Uterine fibroids  Consults: Hematology, Drs. Chism and Granfortuna  Gastroenterology, Dr. Marina Goodell  Recommendations for Outpatient Follow-up:  1. followup appointment with OB/GYN, Dr. Algie Coffer was scheduled for the patient prior to DC on 01/27/13  2. patient was advised that she needs her regular PCP appointments as well.   Past Medical History  Diagnosis Date  . Uterine fibroids   . Thrombocytopenia   . Pyelonephritis 01/16/2013    R PN -Ecoli - hosp for same  . Anemia    Family History  Problem Relation Age of Onset  . Hypertension Mother   . Kidney failure Father     ESRD since age 50  . Diabetes Mellitus II Son   . Osteoarthritis Mother   . Hyperlipidemia Mother   . Diabetes Sister   . Ovarian cancer Sister 32    died of same age 82  . Dementia Paternal Grandmother   . Stroke Maternal Grandmother    History  Substance Use Topics  . Smoking status: Current Some Day Smoker  . Smokeless tobacco: Not on file  . Alcohol Use: Yes     Comment: Socially     Review of Systems Constitutional: Negative for fever or weight change.  Respiratory: Negative for cough and shortness of breath.   Cardiovascular: Negative for chest pain or palpitations.  Gastrointestinal: Negative for abdominal pain, no bowel changes.  Musculoskeletal: Negative for gait problem or joint swelling.  Skin: Negative for rash.  Neurological: Negative for dizziness or headache.  No other  specific complaints in a complete review of systems (except as listed in HPI above).     Objective:   Physical Exam BP 128/84  Pulse 87  Temp(Src) 98.2 F (36.8 C) (Oral)  Ht 5\' 6"  (1.676 m)  Wt 177 lb 12.8 oz (80.65 kg)  BMI 28.71 kg/m2  SpO2 98%  LMP 12/20/2012 Wt Readings from Last 3 Encounters:  01/30/13 177 lb 12.8 oz (80.65 kg)  01/17/13 192 lb 3.2 oz (87.181 kg)   Constitutional: She is obese, but appears well-developed and well-nourished. No distress.  HENT: Head: Normocephalic and atraumatic. Ears: B TMs ok, no erythema or effusion; Nose: Nose normal. Mouth/Throat: Oropharynx is clear and moist. No oropharyngeal exudate.  Eyes: Conjunctivae and EOM are normal. Pupils are equal, round, and reactive to light. No scleral icterus.  Neck: Normal range of motion. Neck supple. No JVD present. No thyromegaly present.  Cardiovascular: Normal rate, regular rhythm and normal heart sounds.  No murmur heard. No BLE edema. Pulmonary/Chest: Effort normal and breath sounds normal. No respiratory distress. She has no wheezes.  Abdominal: Soft. Bowel sounds are normal. She exhibits no distension. There is no tenderness. no masses Musculoskeletal: B knee - boggy synovitis - tender to palpation over joint line; FROM and ligamentous function intact. Normal range of motion, no joint effusions. No gross deformities Neurological: She is alert and oriented to person, place, and time. No cranial nerve deficit. Coordination, balance, strength, speech and  gait are normal.  Skin: Skin is warm and dry. No rash noted. No erythema.  Psychiatric: She has a normal mood and affect. Her behavior is normal. Judgment and thought content normal.   Lab Results  Component Value Date   WBC 8.5 01/19/2013   HGB 7.1* 01/19/2013   HCT 24.9* 01/19/2013   PLT 107* 01/19/2013   GLUCOSE 84 01/17/2013   ALT 13 01/17/2013   AST 19 01/17/2013   NA 132* 01/17/2013   K 3.0* 01/17/2013   CL 98 01/17/2013   CREATININE 0.57  01/17/2013   BUN 4* 01/17/2013   CO2 23 01/17/2013   INR 1.14 01/18/2013       Assessment & Plan:   CPx/v70.0 - Patient has been counseled on age-appropriate routine health concerns for screening and prevention. These are reviewed and up-to-date. Immunizations are up-to-date or declined. Labs ordered and reviewed.  Also see problem list. Medications and labs reviewed today.

## 2013-01-30 NOTE — Patient Instructions (Addendum)
It was good to see you today. We have reviewed your prior records including labs and tests today Health Maintenance reviewed - will refer for colonoscopy screening; followup with mammogram as planned - all other recommended immunizations and age-appropriate screenings are up-to-date. Medications reviewed and updated, stop Protonix and begin tramadol with Tylenol for arthritis pain as needed -no other changes recommended at this time.  Your prescription(s) have been submitted to your pharmacy. Please take as directed and contact our office if you believe you are having problem(s) with the medication(s). Test(s) ordered today. Your results will be released to MyChart (or called to you) after review, usually within 72hours after test completion. If any changes need to be made, you will be notified at that same time. Continue working with Dr. Ernestina Penna as ongoing for controlling your fibroids and heavy periods Followup in 3 months for recheck of anemia, call sooner if problems  Health Maintenance, Females A healthy lifestyle and preventative care can promote health and wellness.  Maintain regular health, dental, and eye exams.  Eat a healthy diet. Foods like vegetables, fruits, whole grains, low-fat dairy products, and lean protein foods contain the nutrients you need without too many calories. Decrease your intake of foods high in solid fats, added sugars, and salt. Get information about a proper diet from your caregiver, if necessary.  Regular physical exercise is one of the most important things you can do for your health. Most adults should get at least 150 minutes of moderate-intensity exercise (any activity that increases your heart rate and causes you to sweat) each week. In addition, most adults need muscle-strengthening exercises on 2 or more days a week.   Maintain a healthy weight. The body mass index (BMI) is a screening tool to identify possible weight problems. It provides an estimate of  body fat based on height and weight. Your caregiver can help determine your BMI, and can help you achieve or maintain a healthy weight. For adults 20 years and older:  A BMI below 18.5 is considered underweight.  A BMI of 18.5 to 24.9 is normal.  A BMI of 25 to 29.9 is considered overweight.  A BMI of 30 and above is considered obese.  Maintain normal blood lipids and cholesterol by exercising and minimizing your intake of saturated fat. Eat a balanced diet with plenty of fruits and vegetables. Blood tests for lipids and cholesterol should begin at age 99 and be repeated every 5 years. If your lipid or cholesterol levels are high, you are over 50, or you are a high risk for heart disease, you may need your cholesterol levels checked more frequently.Ongoing high lipid and cholesterol levels should be treated with medicines if diet and exercise are not effective.  If you smoke, find out from your caregiver how to quit. If you do not use tobacco, do not start.  If you are pregnant, do not drink alcohol. If you are breastfeeding, be very cautious about drinking alcohol. If you are not pregnant and choose to drink alcohol, do not exceed 1 drink per day. One drink is considered to be 12 ounces (355 mL) of beer, 5 ounces (148 mL) of wine, or 1.5 ounces (44 mL) of liquor.  Avoid use of street drugs. Do not share needles with anyone. Ask for help if you need support or instructions about stopping the use of drugs.  High blood pressure causes heart disease and increases the risk of stroke. Blood pressure should be checked at least every 1 to  2 years. Ongoing high blood pressure should be treated with medicines, if weight loss and exercise are not effective.  If you are 35 to 50 years old, ask your caregiver if you should take aspirin to prevent strokes.  Diabetes screening involves taking a blood sample to check your fasting blood sugar level. This should be done once every 3 years, after age 47, if you  are within normal weight and without risk factors for diabetes. Testing should be considered at a younger age or be carried out more frequently if you are overweight and have at least 1 risk factor for diabetes.  Breast cancer screening is essential preventative care for women. You should practice "breast self-awareness." This means understanding the normal appearance and feel of your breasts and may include breast self-examination. Any changes detected, no matter how small, should be reported to a caregiver. Women in their 70s and 30s should have a clinical breast exam (CBE) by a caregiver as part of a regular health exam every 1 to 3 years. After age 87, women should have a CBE every year. Starting at age 33, women should consider having a mammogram (breast X-ray) every year. Women who have a family history of breast cancer should talk to their caregiver about genetic screening. Women at a high risk of breast cancer should talk to their caregiver about having an MRI and a mammogram every year.  The Pap test is a screening test for cervical cancer. Women should have a Pap test starting at age 85. Between ages 89 and 28, Pap tests should be repeated every 2 years. Beginning at age 40, you should have a Pap test every 3 years as long as the past 3 Pap tests have been normal. If you had a hysterectomy for a problem that was not cancer or a condition that could lead to cancer, then you no longer need Pap tests. If you are between ages 67 and 33, and you have had normal Pap tests going back 10 years, you no longer need Pap tests. If you have had past treatment for cervical cancer or a condition that could lead to cancer, you need Pap tests and screening for cancer for at least 20 years after your treatment. If Pap tests have been discontinued, risk factors (such as a new sexual partner) need to be reassessed to determine if screening should be resumed. Some women have medical problems that increase the chance of  getting cervical cancer. In these cases, your caregiver may recommend more frequent screening and Pap tests.  The human papillomavirus (HPV) test is an additional test that may be used for cervical cancer screening. The HPV test looks for the virus that can cause the cell changes on the cervix. The cells collected during the Pap test can be tested for HPV. The HPV test could be used to screen women aged 53 years and older, and should be used in women of any age who have unclear Pap test results. After the age of 53, women should have HPV testing at the same frequency as a Pap test.  Colorectal cancer can be detected and often prevented. Most routine colorectal cancer screening begins at the age of 70 and continues through age 30. However, your caregiver may recommend screening at an earlier age if you have risk factors for colon cancer. On a yearly basis, your caregiver may provide home test kits to check for hidden blood in the stool. Use of a small camera at the end of a  tube, to directly examine the colon (sigmoidoscopy or colonoscopy), can detect the earliest forms of colorectal cancer. Talk to your caregiver about this at age 62, when routine screening begins. Direct examination of the colon should be repeated every 5 to 10 years through age 72, unless early forms of pre-cancerous polyps or small growths are found.  Hepatitis C blood testing is recommended for all people born from [birth year] through 1963 and any individual with known risks for hepatitis C.  Practice safe sex. Use condoms and avoid high-risk sexual practices to reduce the spread of sexually transmitted infections (STIs). Sexually active women aged 16 and younger should be checked for Chlamydia, which is a common sexually transmitted infection. Older women with new or multiple partners should also be tested for Chlamydia. Testing for other STIs is recommended if you are sexually active and at increased risk.  Osteoporosis is a disease in  which the bones lose minerals and strength with aging. This can result in serious bone fractures. The risk of osteoporosis can be identified using a bone density scan. Women ages 35 and over and women at risk for fractures or osteoporosis should discuss screening with their caregivers. Ask your caregiver whether you should be taking a calcium supplement or vitamin D to reduce the rate of osteoporosis.  Menopause can be associated with physical symptoms and risks. Hormone replacement therapy is available to decrease symptoms and risks. You should talk to your caregiver about whether hormone replacement therapy is right for you.  Use sunscreen with a sun protection factor (SPF) of 30 or greater. Apply sunscreen liberally and repeatedly throughout the day. You should seek shade when your shadow is shorter than you. Protect yourself by wearing long sleeves, pants, a wide-brimmed hat, and sunglasses year round, whenever you are outdoors.  Notify your caregiver of new moles or changes in moles, especially if there is a change in shape or color. Also notify your caregiver if a mole is larger than the size of a pencil eraser.  Stay current with your immunizations. Document Released: 11/06/2010 Document Revised: 07/16/2011 Document Reviewed: 11/06/2010 [facility] Patient Information 2014 [place], [place].

## 2013-01-31 ENCOUNTER — Encounter: Payer: Self-pay | Admitting: Internal Medicine

## 2013-01-31 DIAGNOSIS — M1710 Unilateral primary osteoarthritis, unspecified knee: Secondary | ICD-10-CM | POA: Insufficient documentation

## 2013-01-31 NOTE — Assessment & Plan Note (Signed)
On frequent goody powder and ibuprofen PTA (01/2013 hosp for PN) FOB negative so GI eval delayed (despite severe anemia) L>R knee symptoms - Advised scheduled tylenol with tramadol prn pain - new rx done Consider eval by ortho or steroid injection as needed once acute issues stabilize

## 2013-01-31 NOTE — Assessment & Plan Note (Signed)
Severe, iron deficiency related to menometrorrhagia and uterine fibroids ([place] 01/2013) Hgb 4 on 01/2013 admission for PN, transfusion - on iron BID Ongoing eval by gyn - to take progesterone at start of next cycle -  Pt hoping for hysterectomy rather than pills - Continue tx as per gyn and follow up as planned Recheck CBC now

## 2013-01-31 NOTE — Assessment & Plan Note (Signed)
Acute phase reaction to severe PN hosp for same 01/2013 1 pak plt transfusion on admission 01/16/13 due to plt 27K No evidence for ITP, DIC or TTP - no MDS on heme onc eval No hx prior bleeding complications Recheck CBC now

## 2013-01-31 NOTE — Assessment & Plan Note (Signed)
R PN on [place] 01/2013 hosp for same (E coli on cx) - no obstruction or mass symptoms resolved recheck UA now

## 2013-02-02 ENCOUNTER — Telehealth: Payer: Self-pay | Admitting: *Deleted

## 2013-02-02 NOTE — Telephone Encounter (Signed)
Left a message for patient to call me. 

## 2013-02-02 NOTE — Telephone Encounter (Signed)
Continue with tramadol in addition to Tylenol -no additional changes needed at this time Please call if further evaluation for back and knee arthritis symptoms is needed

## 2013-02-02 NOTE — Telephone Encounter (Signed)
Called Deborah Cabrera to notified her of her labs. Deborah Cabrera states she is still having low back pain. Just started taking the tramadol this am...lmb

## 2013-02-02 NOTE — Telephone Encounter (Signed)
Notified pt with md response.../lmb 

## 2013-02-02 NOTE — Telephone Encounter (Signed)
Message copied by Daphine Deutscher on Mon Feb 02, 2013  3:54 PM ------      Message from: Daphine Deutscher      Created: Fri Jan 16, 2013  4:06 PM       Need post hospital f/u in 3 weeks with Amy.10/6 week ------

## 2013-02-03 NOTE — Telephone Encounter (Signed)
Spoke with patient and scheduled her on 02/10/12 at 1:30 PM.

## 2013-02-06 ENCOUNTER — Encounter: Payer: Self-pay | Admitting: *Deleted

## 2013-02-09 ENCOUNTER — Other Ambulatory Visit: Payer: Self-pay | Admitting: *Deleted

## 2013-02-09 ENCOUNTER — Encounter: Payer: Self-pay | Admitting: Physician Assistant

## 2013-02-09 ENCOUNTER — Ambulatory Visit (INDEPENDENT_AMBULATORY_CARE_PROVIDER_SITE_OTHER): Payer: BC Managed Care – PPO | Admitting: Physician Assistant

## 2013-02-09 VITALS — BP 100/60 | HR 98 | Ht 66.0 in | Wt 176.8 lb

## 2013-02-09 DIAGNOSIS — D649 Anemia, unspecified: Secondary | ICD-10-CM

## 2013-02-09 DIAGNOSIS — R195 Other fecal abnormalities: Secondary | ICD-10-CM

## 2013-02-09 DIAGNOSIS — D509 Iron deficiency anemia, unspecified: Secondary | ICD-10-CM

## 2013-02-09 MED ORDER — MOVIPREP 100 G PO SOLR: 1.0000 | Freq: Once | ORAL | Status: DC

## 2013-02-09 NOTE — Patient Instructions (Addendum)
You have been scheduled for a colonoscopy with propofol. Please follow written instructions given to you at your visit today.  Please pick up your prep kit at the pharmacy within the next 1-3 days. If you use inhalers (even only as needed), please bring them with you on the day of your procedure.  

## 2013-02-09 NOTE — Progress Notes (Signed)
Agree with assessment and plan. Patient known to me from the hospital. She should undergo EGD if colonoscopy is negative. Would set her up for both procedures at the same time. Deborah Cabrera please arrange. Thank you

## 2013-02-09 NOTE — Progress Notes (Signed)
Subjective:    Patient ID: Deborah Cabrera, female    DOB: August 25, 1960, 52 y.o.   MRN: [id number]  HPI Deborah Cabrera is a pleasant 52 year old African American female who was seen initially in consultation during recent hospitalization at [facility] in September 2014 when she was admitted with pyelonephritis. Admission labs also showed a profound anemia with hemoglobin of 4 MCV in the 60s and platelets of 27,000. She was transfused up to a hemoglobin of 7.4. Found to have a significant iron deficiency with serum iron of 28 TIBC of 431 iron saturation of 6 and ferritin of 12. She was documented to be Hemoccult positive but had no other GI symptoms. She was seen in consultation by hematology for the thrombocytopenia which corrected without intervention and was felt secondary to underlying infection/pyelonephritis. History revealed significant menorrhagia type symptoms and this was felt most likely the etiology of her severe anemia. She has since been seen outpatient by her gynecologist and hysterectomy is being considered. She did have a pelvic ultrasound done that showed several uterine fibroids. Patient has no current complaints and says she feels much better. Her hemoglobin on 01/30/2013 was up to 11.5 hematocrit of 36 and platelets of 685. She is taking oral iron and seems to be tolerating this well. She has no complaints of abdominal pain changes in her bowel habits melena or hematochezia. She does say her stools are greenish. Her appetite has been fine and her weight has been stable. She has not had a menstrual cycle since hospitalization. Family history is negative for colon cancer as far she is aware    Review of Systems  Constitutional: Negative.   HENT: Negative.   Eyes: Negative.   Respiratory: Negative.   Cardiovascular: Negative.   Gastrointestinal: Negative.   Endocrine: Negative.   Genitourinary: Negative.   Musculoskeletal: Negative.   Skin: Negative.   Allergic/Immunologic:  Negative.   Neurological: Negative.   Hematological: Negative.   Psychiatric/Behavioral: Negative.    Outpatient Prescriptions Prior to Visit  Medication Sig Dispense Refill  . acetaminophen (TYLENOL) 325 MG tablet Take 650 mg by mouth every 6 (six) hours as needed for pain.      . ferrous gluconate (FERGON) 324 MG tablet Take 1 tablet (324 mg total) by mouth 3 (three) times daily with meals.  90 tablet  3  . folic acid (FOLVITE) 1 MG tablet Take 1 tablet (1 mg total) by mouth daily.  30 tablet  3  . traMADol (ULTRAM) 50 MG tablet Take 1 tablet (50 mg total) by mouth every 8 (eight) hours as needed for pain.  30 tablet  0   No facility-administered medications prior to visit.      No Known Allergies Patient Active Problem List   Diagnosis Date Noted  . Arthritis of knee 01/31/2013  . Uterine fibroids 01/19/2013  . Anemia 01/16/2013  . Pyelonephritis 01/16/2013  . Thrombocytopenia 01/16/2013   History  Substance Use Topics  . Smoking status: Current Some Day Smoker    Types: Cigarettes  . Smokeless tobacco: Not on file  . Alcohol Use: Yes     Comment: Socially   family history includes Alcohol abuse in her father; Dementia in her paternal grandmother; Diabetes in her sister; Diabetes Mellitus II in her son; Hyperlipidemia in her mother; Hypertension in her mother; Kidney failure in her father; Osteoarthritis in her mother; Ovarian cancer (age of onset: 46) in her sister; Stroke in her maternal grandmother. There is no history of Colon cancer.  Objective:   Physical Exam  acute distress, pleasant blood pressure 100/60 pulse 98 height 5 foot 6 weight 176. HEENT; nontraumatic normocephalic EOMI PERRLA sclera anicteric, Supple; no JVD, Cardiovascular; regular rate and rhythm with S1-S2 no murmur or gallop, Pulmonary; clear bilaterally, Abdomen; soft nontender nondistended bowel sounds are active there is no palpable mass or hepatosplenomegaly, Rectal; exam not repeated she was  documented Hemoccult positive during recent hospitalization, Extremities; no clubbing cyanosis or edema skin warm and dry, Psych; mood and affect normal and appropriate.        Assessment & Plan:  #11 52 year old African American female with a severe iron deficiency anemia and presenting hemoglobin of 4. Patient had Hemoccult-positive stools but also had significant history of menorrhagia. GYN evaluation is ongoing and hysterectomy is being considered. Patient has not had any prior colon screening and do to age and Hemoccult-positive stool in addition to the iron deficiency anemia she needs colonoscopy to rule out occult lesion #2 recent pyelonephritis resolved #3 thrombocytopenia secondary to infection, resolved  Plan; patient will continue oral iron supplementation. Will schedule for colonoscopy with Dr. Garner Gavel was discussed in detail with the patient and she is agreeable to proceed.

## 2013-02-10 ENCOUNTER — Telehealth: Payer: Self-pay | Admitting: *Deleted

## 2013-02-10 ENCOUNTER — Encounter: Payer: Self-pay | Admitting: Internal Medicine

## 2013-02-10 ENCOUNTER — Other Ambulatory Visit: Payer: Self-pay | Admitting: *Deleted

## 2013-02-10 NOTE — Telephone Encounter (Signed)
I called the patient to advise the Dr. Marina Goodell said , if the colonoscopy is negative he may want to look into her stomach.  I told her on 10-16 the day of her procedure there would not be enough time on the schedule to do this.  I asked if she would be willing to be change her date to 03-10-2013, Tues.  She said that was fine with her and she has someone that can bring her on that day.  I told her I would mail out new instructions, which I did , today 02-10-2013.

## 2013-02-10 NOTE — Progress Notes (Signed)
OK 

## 2013-02-16 ENCOUNTER — Other Ambulatory Visit: Payer: Self-pay | Admitting: Internal Medicine

## 2013-02-16 NOTE — Telephone Encounter (Signed)
Faxed script back to [facility].../lmb 

## 2013-02-19 ENCOUNTER — Encounter: Payer: BC Managed Care – PPO | Admitting: Internal Medicine

## 2013-03-10 ENCOUNTER — Encounter: Payer: Self-pay | Admitting: Internal Medicine

## 2013-03-10 ENCOUNTER — Ambulatory Visit (AMBULATORY_SURGERY_CENTER): Payer: BC Managed Care – PPO | Admitting: Internal Medicine

## 2013-03-10 VITALS — BP 176/116 | HR 74 | Temp 98.3°F | Resp 18 | Ht 66.0 in | Wt 176.0 lb

## 2013-03-10 DIAGNOSIS — R195 Other fecal abnormalities: Secondary | ICD-10-CM

## 2013-03-10 DIAGNOSIS — D509 Iron deficiency anemia, unspecified: Secondary | ICD-10-CM

## 2013-03-10 DIAGNOSIS — Z1211 Encounter for screening for malignant neoplasm of colon: Secondary | ICD-10-CM

## 2013-03-10 DIAGNOSIS — K2970 Gastritis, unspecified, without bleeding: Secondary | ICD-10-CM

## 2013-03-10 MED ORDER — SODIUM CHLORIDE 0.9 % IV SOLN: 500.0000 mL | INTRAVENOUS | Status: DC

## 2013-03-10 NOTE — Progress Notes (Signed)
Patient did not have preoperative order for IV antibiotic SSI prophylaxis. (G8918)  Patient did not experience any of the following events: a burn prior to discharge; a fall within the facility; wrong site/side/patient/procedure/implant event; or a hospital transfer or hospital admission upon discharge from the facility. (G8907)  

## 2013-03-10 NOTE — Progress Notes (Signed)
Dr.PERRY Made aware of blood pressure of 185/120 and 204/133 he stated that they will continue to monitor throughout procedure and refer to primary doctor for follow up.

## 2013-03-10 NOTE — Op Note (Signed)
[facility] [address] [place], [address]   ENDOSCOPY PROCEDURE REPORT  PATIENT: Deborah Cabrera, Deborah Cabrera  MR#: [id number] BIRTHDATE: [birth year] , 52  yrs. old GENDER: Female ENDOSCOPIST: Roxy Cedar, MD REFERRED BY:  Rene Paci, M.D. PROCEDURE DATE:  03/10/2013 PROCEDURE:  EGD w/ biopsy ASA CLASS:     Class II INDICATIONS:  Iron deficiency anemia.   Heme positive stool. MEDICATIONS: MAC sedation, administered by CRNA and propofol (Diprivan) 100mg  IV TOPICAL ANESTHETIC: none  DESCRIPTION OF PROCEDURE: After the risks benefits and alternatives of the procedure were thoroughly explained, informed consent was obtained.  The LB [id number] endoscope was introduced through the mouth and advanced to the second portion of the duodenum. Without limitations.  The instrument was slowly withdrawn as the mucosa was fully examined.    EXAM:The esophagus was normal.  Stomach revealed erythema and a few erosions in the antrum consistent with nonspecific gastritis.  The duodenal bulb and post bulbar duodenum are normal.  Retroflexed views revealed no abnormalities.     The scope was then withdrawn from the patient and the procedure completed.  COMPLICATIONS: There were no complications. ENDOSCOPIC IMPRESSION: 1. Gastritis. Could explain heme positive stool.  RECOMMENDATIONS: 1.  Continue iron replacement 2.  Avoid NSAIDS , as this can cause gastritis 3.  Rx CLO if positive 4. Please see Dr. Bayard Hugger the ASAP regarding persistently elevated blood pressure noticed today  REPEAT EXAM:  eSigned:  Roxy Cedar, MD 03/10/2013 3:23 PM   HQ:IONGEX[facility] A Felicity Coyer, MD and The Patient

## 2013-03-10 NOTE — Progress Notes (Signed)
Called to room to assist during endoscopic procedure.  Patient ID and intended procedure confirmed with present staff. Received instructions for my participation in the procedure from the performing physician.  

## 2013-03-10 NOTE — Patient Instructions (Signed)
YOU HAD AN ENDOSCOPIC PROCEDURE TODAY AT THE [facility]: Refer to the procedure report that was given to you for any specific questions about what was found during the examination.  If the procedure report does not answer your questions, please call your gastroenterologist to clarify.  If you requested that your care partner not be given the details of your procedure findings, then the procedure report has been included in a sealed envelope for you to review at your convenience later.  YOU SHOULD EXPECT: Some feelings of bloating in the abdomen. Passage of more gas than usual.  Walking can help get rid of the air that was put into your GI tract during the procedure and reduce the bloating. If you had a lower endoscopy (such as a colonoscopy or flexible sigmoidoscopy) you may notice spotting of blood in your stool or on the toilet paper. If you underwent a bowel prep for your procedure, then you may not have a normal bowel movement for a few days.  DIET: Your first meal following the procedure should be a light meal and then it is ok to progress to your normal diet.  A half-sandwich or bowl of soup is an example of a good first meal.  Heavy or fried foods are harder to digest and may make you feel nauseous or bloated.  Likewise meals heavy in dairy and vegetables can cause extra gas to form and this can also increase the bloating.  Drink plenty of fluids but you should avoid alcoholic beverages for 24 hours.  ACTIVITY: Your care partner should take you home directly after the procedure.  You should plan to take it easy, moving slowly for the rest of the day.  You can resume normal activity the day after the procedure however you should NOT DRIVE or use heavy machinery for 24 hours (because of the sedation medicines used during the test).    SYMPTOMS TO REPORT IMMEDIATELY: A gastroenterologist can be reached at any hour.  During normal business hours, 8:30 AM to 5:00 PM Monday through Friday,  call [phone].  After hours and on weekends, please call the GI answering service at [phone] who will take a message and have the physician on call contact you.   Following lower endoscopy (colonoscopy or flexible sigmoidoscopy):  Excessive amounts of blood in the stool  Significant tenderness or worsening of abdominal pains  Swelling of the abdomen that is new, acute  Fever of 100F or higher  Following upper endoscopy (EGD)  Vomiting of blood or coffee ground material  New chest pain or pain under the shoulder blades  Painful or persistently difficult swallowing  New shortness of breath  Fever of 100F or higher  Black, tarry-looking stools  FOLLOW UP: If any biopsies were taken you will be contacted by phone or by letter within the next 1-3 weeks.  Call your gastroenterologist if you have not heard about the biopsies in 3 weeks.  Our staff will call the home number listed on your records the next business day following your procedure to check on you and address any questions or concerns that you may have at that time regarding the information given to you following your procedure. This is a courtesy call and so if there is no answer at the home number and we have not heard from you through the emergency physician on call, we will assume that you have returned to your regular daily activities without incident.  SIGNATURES/CONFIDENTIALITY: You and/or your care   partner have signed paperwork which will be entered into your electronic medical record.  These signatures attest to the fact that that the information above on your After Visit Summary has been reviewed and is understood.  Full responsibility of the confidentiality of this discharge information lies with you and/or your care-partner.  Recommendations See procedure report 

## 2013-03-10 NOTE — Progress Notes (Signed)
Report to pacu rn, vss, bbs=clear, Patients BP elevated Dr. Marina Goodell consulted, Discussed Beta blockade at this time. Dr. Marina Goodell prefers to consult primary care Phys. And will monitor closely poistop.

## 2013-03-10 NOTE — Op Note (Signed)
[facility] [address] [place], [address]   COLONOSCOPY PROCEDURE REPORT  PATIENT: Deborah Cabrera, Deborah Cabrera  MR#: [id number] BIRTHDATE: 05-10-1960 , 52  yrs. old GENDER: Female ENDOSCOPIST: Roxy Cedar, MD REFERRED [id number] Felicity Coyer, M.D. PROCEDURE DATE:  03/10/2013 PROCEDURE:   Colonoscopy, screening First Screening Colonoscopy - Avg.  risk and is 50 yrs.  old or older - No.  Prior Negative Screening - Now for repeat screening. N/A  History of Adenoma - Now for follow-up colonoscopy & has been > or = to 3 yrs.  N/A  Polyps Removed Today? No.  Recommend repeat exam, <10 yrs? No. ASA CLASS:   Class II INDICATIONS:average risk screening, Iron Deficiency Anemia, and heme-positive stool. MEDICATIONS: MAC sedation, administered by CRNA and propofol (Diprivan) 300mg  IV DESCRIPTION OF PROCEDURE:   After the risks benefits and alternatives of the procedure were thoroughly explained, informed consent was obtained.  A digital rectal exam revealed no abnormalities of the rectum.   The LB [id number]  endoscope was introduced through the anus and advanced to the cecum, which was identified by both the appendix and ileocecal valve. No adverse events experienced.   The quality of the prep was excellent, using MoviPrep  The instrument was then slowly withdrawn as the colon was fully examined.  COLON FINDINGS: The mucosa appeared normal in the terminal ileum. Moderate diverticulosis was noted The finding was in the right colon and The finding was left colon.   The colon mucosa was otherwise normal.  Retroflexed views revealed internal hemorrhoids. The time to cecum=3 minutes 39 seconds.  Withdrawal time=8 minutes 59 seconds.  The scope was withdrawn and the procedure completed. COMPLICATIONS: There were no complications.  ENDOSCOPIC IMPRESSION: 1.   Normal mucosa in the terminal ileum 2.   Moderate diverticulosis was noted in the right colon and left colon 3.   The  colon mucosa was otherwise normal  RECOMMENDATIONS: 1.  Continue current colorectal screening recommendations for "routine risk" patients with a repeat colonoscopy in 10 years. 2.  Upper endoscopy today (see report)   eSigned:  Roxy Cedar, MD 03/10/2013 3:18 PM   cc: Newt Lukes, MD

## 2013-03-11 ENCOUNTER — Telehealth: Payer: Self-pay | Admitting: *Deleted

## 2013-03-11 ENCOUNTER — Other Ambulatory Visit: Payer: Self-pay | Admitting: Internal Medicine

## 2013-03-11 NOTE — Telephone Encounter (Signed)
  Follow up Call-  Call back number 03/10/2013  Post procedure Call Back phone  # [phone]  Permission to leave phone message Yes     Patient questions:  Do you have a fever, pain , or abdominal swelling? no Pain Score  0 *  Have you tolerated food without any problems? yes  Have you been able to return to your normal activities? yes  Do you have any questions about your discharge instructions: Diet   no Medications  no Follow up visit  no  Do you have questions or concerns about your Care? no  Actions: * If pain score is 4 or above: No action needed, pain <4.

## 2013-03-12 LAB — HELICOBACTER PYLORI SCREEN-BIOPSY: UREASE: NEGATIVE

## 2013-03-12 NOTE — Telephone Encounter (Signed)
Faxed script back to [facility].../lmb 

## 2013-03-23 LAB — HM MAMMOGRAPHY

## 2013-05-04 ENCOUNTER — Encounter: Payer: Self-pay | Admitting: Internal Medicine

## 2013-05-04 ENCOUNTER — Ambulatory Visit (INDEPENDENT_AMBULATORY_CARE_PROVIDER_SITE_OTHER): Payer: BC Managed Care – PPO | Admitting: Internal Medicine

## 2013-05-04 VITALS — BP 158/110 | HR 72 | Temp 97.9°F | Wt 178.8 lb

## 2013-05-04 DIAGNOSIS — D649 Anemia, unspecified: Secondary | ICD-10-CM

## 2013-05-04 DIAGNOSIS — F17200 Nicotine dependence, unspecified, uncomplicated: Secondary | ICD-10-CM

## 2013-05-04 DIAGNOSIS — I10 Essential (primary) hypertension: Secondary | ICD-10-CM

## 2013-05-04 MED ORDER — LOSARTAN POTASSIUM 50 MG PO TABS: 50.0000 mg | ORAL_TABLET | Freq: Every day | ORAL | Status: DC

## 2013-05-04 NOTE — Assessment & Plan Note (Signed)
Reviewed uncontrolled BP during colo screen 03/2013 +FH same but pt denies prior rx for high BP Manual recheck today - unchanged Start lorsartan 50mg  qd - ex done Pt will monitor at home and call if SBP>140

## 2013-05-04 NOTE — Patient Instructions (Addendum)
It was good to see you today.  We have reviewed your prior records including labs and tests today  Medications reviewed and updated Start losartan 50 mg once daily for blood pressure control No other changes recommended at this time.  Your prescription(s) have been submitted to your pharmacy. Please take as directed and contact our office if you believe you are having problem(s) with the medication(s).  Monitor blood pressure at home, call if systolic pressure over 140 or diastolic over 85  Low-sodium diet as ongoing  Followup in 6 months for recheck, call sooner if problems  Hypertension As your heart beats, it forces blood through your arteries. This force is your blood pressure. If the pressure is too high, it is called hypertension (HTN) or high blood pressure. HTN is dangerous because you may have it and not know it. High blood pressure may mean that your heart has to work harder to pump blood. Your arteries may be narrow or stiff. The extra work puts you at risk for heart disease, stroke, and other problems.  Blood pressure consists of two numbers, a higher number over a lower, 110/72, for example. It is stated as "110 over 72." The ideal is below 120 for the top number (systolic) and under 80 for the bottom (diastolic). Write down your blood pressure today. You should pay close attention to your blood pressure if you have certain conditions such as:  Heart failure.  Prior heart attack.  Diabetes  Chronic kidney disease.  Prior stroke.  Multiple risk factors for heart disease. To see if you have HTN, your blood pressure should be measured while you are seated with your arm held at the level of the heart. It should be measured at least twice. A one-time elevated blood pressure reading (especially in the Emergency Department) does not mean that you need treatment. There may be conditions in which the blood pressure is different between your right and left arms. It is important to  see your caregiver soon for a recheck. Most people have essential hypertension which means that there is not a specific cause. This type of high blood pressure may be lowered by changing lifestyle factors such as:  Stress.  Smoking.  Lack of exercise.  Excessive weight.  Drug/tobacco/alcohol use.  Eating less salt. Most people do not have symptoms from high blood pressure until it has caused damage to the body. Effective treatment can often prevent, delay or reduce that damage. TREATMENT  When a cause has been identified, treatment for high blood pressure is directed at the cause. There are a large number of medications to treat HTN. These fall into several categories, and your caregiver will help you select the medicines that are best for you. Medications may have side effects. You should review side effects with your caregiver. If your blood pressure stays high after you have made lifestyle changes or started on medicines,   Your medication(s) may need to be changed.  Other problems may need to be addressed.  Be certain you understand your prescriptions, and know how and when to take your medicine.  Be sure to follow up with your caregiver within the time frame advised (usually within two weeks) to have your blood pressure rechecked and to review your medications.  If you are taking more than one medicine to lower your blood pressure, make sure you know how and at what times they should be taken. Taking two medicines at the same time can result in blood pressure that is  too low. SEEK IMMEDIATE MEDICAL CARE IF:  You develop a severe headache, blurred or changing vision, or confusion.  You have unusual weakness or numbness, or a faint feeling.  You have severe chest or abdominal pain, vomiting, or breathing problems. MAKE SURE YOU:   Understand these instructions.  Will watch your condition.  Will get help right away if you are not doing well or get worse. Document Released:  04/23/2005 Document Revised: 07/16/2011 Document Reviewed: 12/12/2007 [facility] Patient Information 2014 [place], [place]. DASH Diet The DASH diet stands for "Dietary Approaches to Stop Hypertension." It is a healthy eating plan that has been shown to reduce high blood pressure (hypertension) in as little as 14 days, while also possibly providing other significant health benefits. These other health benefits include reducing the risk of breast cancer after menopause and reducing the risk of type 2 diabetes, heart disease, colon cancer, and stroke. Health benefits also include weight loss and slowing kidney failure in patients with chronic kidney disease.  DIET GUIDELINES  Limit salt (sodium). Your diet should contain less than 1500 mg of sodium daily.  Limit refined or processed carbohydrates. Your diet should include mostly whole grains. Desserts and added sugars should be used sparingly.  Include small amounts of heart-healthy fats. These types of fats include nuts, oils, and tub margarine. Limit saturated and trans fats. These fats have been shown to be harmful in the body. CHOOSING FOODS  The following food groups are based on a 2000 calorie diet. See your Registered Dietitian for individual calorie needs. Grains and Grain Products (6 to 8 servings daily)  Eat More Often: Whole-wheat bread, brown rice, whole-grain or wheat pasta, quinoa, popcorn without added fat or salt (air popped).  Eat Less Often: White bread, white pasta, white rice, cornbread. Vegetables (4 to 5 servings daily)  Eat More Often: Fresh, frozen, and canned vegetables. Vegetables may be raw, steamed, roasted, or grilled with a minimal amount of fat.  Eat Less Often/Avoid: Creamed or fried vegetables. Vegetables in a cheese sauce. Fruit (4 to 5 servings daily)  Eat More Often: All fresh, canned (in natural juice), or frozen fruits. Dried fruits without added sugar. One hundred percent fruit juice ( cup [237 mL]  daily).  Eat Less Often: Dried fruits with added sugar. Canned fruit in light or heavy syrup. [employer], Fish, and Poultry (2 servings or less daily. One serving is 3 to 4 oz [85-114 g]).  Eat More Often: Ninety percent or leaner ground beef, tenderloin, sirloin. Round cuts of beef, chicken breast, [place] breast. All fish. Grill, bake, or broil your meat. Nothing should be fried.  Eat Less Often/Avoid: Fatty cuts of meat, [place], or chicken leg, thigh, or wing. Fried cuts of meat or fish. Dairy (2 to 3 servings)  Eat More Often: Low-fat or fat-free milk, low-fat plain or light yogurt, reduced-fat or part-skim cheese.  Eat Less Often/Avoid: Milk (whole, 2%).Whole milk yogurt. Full-fat cheeses. Nuts, Seeds, and Legumes (4 to 5 servings per week)  Eat More Often: All without added salt.  Eat Less Often/Avoid: Salted nuts and seeds, canned beans with added salt. Fats and Sweets (limited)  Eat More Often: Vegetable oils, tub margarines without trans fats, sugar-free gelatin. Mayonnaise and salad dressings.  Eat Less Often/Avoid: Coconut oils, palm oils, butter, stick margarine, cream, half and half, cookies, candy, pie. FOR MORE INFORMATION The Dash Diet Eating Plan: www.dashdiet.org Document Released: 04/12/2011 Document Revised: 07/16/2011 Document Reviewed: 04/12/2011 [facility] Patient Information 2014 [place], [place].

## 2013-05-04 NOTE — Progress Notes (Signed)
Pre-visit discussion using our clinic review tool. No additional management support is needed unless otherwise documented below in the visit note.  

## 2013-05-04 NOTE — Progress Notes (Signed)
  Subjective:    Patient ID: Deborah Cabrera, female    DOB: 1960-12-28, 52 y.o.   MRN: [id number]  HPI Here for 3 mo follow up - reviewed chronic medical issues and interval medical events  Past Medical History  Diagnosis Date  . Uterine fibroids     dx 01/2013 by [place] in setting of severe iron def anemia  . Thrombocytopenia 01/16/13    in setting of pyelonephritis hosp, 1 plt pk transfusion when plt 27K  . Pyelonephritis 01/16/2013    R PN -Ecoli - hosp for same  . Anemia     Hgb 4 01/2013 due to menometorhagia  . Menometrorrhagia      Review of Systems  Constitutional: Negative for fever, fatigue and unexpected weight change.  Respiratory: Negative for cough and shortness of breath.   Cardiovascular: Negative for chest pain and leg swelling.  Gastrointestinal: Negative for abdominal pain.  Genitourinary: Positive for menstrual problem (but improved).        Objective:   Physical Exam BP 158/110  Pulse 72  Temp(Src) 97.9 F (36.6 C) (Oral)  Wt 178 lb 12.8 oz (81.103 kg)  SpO2 98% Wt Readings from Last 3 Encounters:  05/04/13 178 lb 12.8 oz (81.103 kg)  03/10/13 176 lb (79.833 kg)  02/09/13 176 lb 12.8 oz (80.196 kg)   Constitutional: She is obese, but appears well-developed and well-nourished. No distress.  Neck: Normal range of motion. Neck supple. No JVD present. No thyromegaly present.  Cardiovascular: Normal rate, regular rhythm and normal heart sounds.  No murmur heard. No BLE edema. Pulmonary/Chest: Effort normal and breath sounds normal. No respiratory distress. She has no wheezes.  Psychiatric: She has a normal mood and affect. Her behavior is normal. Judgment and thought content normal.   Lab Results  Component Value Date   WBC 6.8 01/30/2013   HGB 11.5* 01/30/2013   HCT 36.0 01/30/2013   PLT 685.0* 01/30/2013   GLUCOSE 82 01/30/2013   CHOL 134 01/30/2013   TRIG 121.0 01/30/2013   HDL 65.00 01/30/2013   LDLCALC 45 01/30/2013   ALT 13 01/17/2013   AST 19 01/17/2013    NA 142 01/30/2013   K 4.1 01/30/2013   CL 107 01/30/2013   CREATININE 0.7 01/30/2013   BUN 11 01/30/2013   CO2 27 01/30/2013   TSH 0.94 01/30/2013   INR 1.14 01/18/2013       Assessment & Plan:   see problem list. Medications and labs reviewed today.  5 minutes today spent counseling patient on unhealthy effects of continued tobacco abuse and encouragement of cessation including medical options available to help the patient quit smoking.

## 2013-05-04 NOTE — Assessment & Plan Note (Signed)
Severe, iron deficiency related to menometrorrhagia and uterine fibroids ([place] 01/2013) Hgb 4 on 01/2013 hosp admission for PN, transfusion - on iron BID Ongoing eval by gyn -uses progesterone at start of next cycle -  Pt hoping for hysterectomy rather than pills - GI eval 03/2013 - mild gastritis - colo clear Continue tx as per gyn and follow up as planne

## 2013-06-01 ENCOUNTER — Telehealth: Payer: Self-pay | Admitting: Internal Medicine

## 2013-06-01 NOTE — Telephone Encounter (Signed)
relevant patient education mailed to patient

## 2013-06-11 ENCOUNTER — Other Ambulatory Visit: Payer: Self-pay | Admitting: Internal Medicine

## 2013-06-11 NOTE — Telephone Encounter (Signed)
Faxed script back to [facility].../lmb 

## 2013-08-05 ENCOUNTER — Other Ambulatory Visit: Payer: Self-pay | Admitting: *Deleted

## 2013-08-05 MED ORDER — TRAMADOL HCL 50 MG PO TABS: 50.0000 mg | ORAL_TABLET | Freq: Four times a day (QID) | ORAL | Status: DC | PRN

## 2013-08-05 NOTE — Telephone Encounter (Signed)
Faxed script back to [facility].../lmb 

## 2013-11-02 ENCOUNTER — Ambulatory Visit: Payer: BC Managed Care – PPO | Admitting: Internal Medicine

## 2013-11-19 ENCOUNTER — Ambulatory Visit: Payer: BC Managed Care – PPO | Admitting: Internal Medicine

## 2013-12-19 ENCOUNTER — Ambulatory Visit (INDEPENDENT_AMBULATORY_CARE_PROVIDER_SITE_OTHER): Payer: BC Managed Care – PPO | Admitting: Family Medicine

## 2013-12-19 ENCOUNTER — Encounter: Payer: Self-pay | Admitting: Family Medicine

## 2013-12-19 VITALS — BP 132/98 | Temp 98.2°F | Ht 66.0 in | Wt 184.0 lb

## 2013-12-19 DIAGNOSIS — R300 Dysuria: Secondary | ICD-10-CM

## 2013-12-19 LAB — POCT URINALYSIS DIPSTICK
Bilirubin, UA: NEGATIVE
Glucose, UA: NEGATIVE
Ketones, UA: NEGATIVE
Nitrite, UA: NEGATIVE
SPEC GRAV UA: 1.01
UROBILINOGEN UA: 0.2
pH, UA: 7

## 2013-12-19 MED ORDER — NITROFURANTOIN MONOHYD MACRO 100 MG PO CAPS
100.0000 mg | ORAL_CAPSULE | Freq: Two times a day (BID) | ORAL | Status: DC
Start: 2013-12-19 — End: 2014-02-14

## 2013-12-19 NOTE — Patient Instructions (Signed)

## 2013-12-19 NOTE — Progress Notes (Signed)
   Subjective:    Patient ID: Deborah Cabrera, female    DOB: 03-11-1961, 53 y.o.   MRN: [id number]  Dysuria  Associated symptoms include frequency. Pertinent negatives include no chills, nausea or vomiting.   Acute visit for 2 day history of urinary symptoms. She describes burning with urination and frequency. Her symptoms are actually slightly improved today, though not resolved. No fever. No nausea or vomiting. She has some poorly localized lower lumbar pain but no flank pain. No gross hematuria  . No known drug allergies  Past Medical History  Diagnosis Date  . Uterine fibroids     dx 01/2013 by [place] in setting of severe iron def anemia  . Thrombocytopenia 01/16/13    in setting of pyelonephritis hosp, 1 plt pk transfusion when plt 27K  . Pyelonephritis 01/16/2013    R PN -Ecoli - hosp for same  . Anemia     Hgb 4 01/2013 due to menometorhagia  . Menometrorrhagia    Past Surgical History  Procedure Laterality Date  . Tubal ligation Bilateral     reports that she has been smoking Cigarettes.  She has been smoking about 0.00 packs per day. She does not have any smokeless tobacco history on file. She reports that she drinks alcohol. She reports that she does not use illicit drugs. family history includes Alcohol abuse in her father; Dementia in her paternal grandmother; Diabetes in her sister; Diabetes Mellitus II in her son; Hyperlipidemia in her mother; Hypertension in her mother; Kidney failure in her father; Osteoarthritis in her mother; Ovarian cancer (age of onset: 59) in her sister; Stroke in her maternal grandmother. There is no history of Colon cancer. No Known Allergies    Review of Systems  Constitutional: Negative for fever, chills and appetite change.  Gastrointestinal: Negative for nausea, vomiting, abdominal pain, diarrhea and constipation.  Genitourinary: Positive for dysuria and frequency.  Musculoskeletal: Negative for back pain.  Neurological: Negative for dizziness.         Objective:   Physical Exam  Constitutional: She appears well-developed and well-nourished.  HENT:  Head: Normocephalic and atraumatic.  Neck: Neck supple. No thyromegaly present.  Cardiovascular: Normal rate, regular rhythm and normal heart sounds.   Pulmonary/Chest: Breath sounds normal.  Abdominal: Soft. Bowel sounds are normal. There is no tenderness.          Assessment & Plan:  Probable uncomplicated cystitis. Macrobid one twice a day for 5 days. Followup with primary if symptoms not resolving

## 2013-12-19 NOTE — Progress Notes (Signed)
Pre visit review using our clinic review tool, if applicable. No additional management support is needed unless otherwise documented below in the visit note. 

## 2013-12-23 ENCOUNTER — Ambulatory Visit (INDEPENDENT_AMBULATORY_CARE_PROVIDER_SITE_OTHER)
Admission: RE | Admit: 2013-12-23 | Discharge: 2013-12-23 | Disposition: A | Discharge status: Home / Self Care | Payer: BC Managed Care – PPO | Source: Ambulatory Visit | Attending: Internal Medicine | Admitting: Internal Medicine

## 2013-12-23 ENCOUNTER — Ambulatory Visit (INDEPENDENT_AMBULATORY_CARE_PROVIDER_SITE_OTHER): Payer: BC Managed Care – PPO | Admitting: Internal Medicine

## 2013-12-23 ENCOUNTER — Encounter: Payer: Self-pay | Admitting: Internal Medicine

## 2013-12-23 VITALS — BP 138/84 | HR 69 | Temp 98.2°F | Ht 66.0 in | Wt 183.0 lb

## 2013-12-23 DIAGNOSIS — M1710 Unilateral primary osteoarthritis, unspecified knee: Secondary | ICD-10-CM

## 2013-12-23 DIAGNOSIS — F17200 Nicotine dependence, unspecified, uncomplicated: Secondary | ICD-10-CM

## 2013-12-23 DIAGNOSIS — IMO0002 Reserved for concepts with insufficient information to code with codable children: Secondary | ICD-10-CM

## 2013-12-23 DIAGNOSIS — I10 Essential (primary) hypertension: Secondary | ICD-10-CM

## 2013-12-23 DIAGNOSIS — Z23 Encounter for immunization: Secondary | ICD-10-CM

## 2013-12-23 MED ORDER — TRAMADOL HCL 50 MG PO TABS: 50.0000 mg | ORAL_TABLET | Freq: Four times a day (QID) | ORAL | Status: DC | PRN

## 2013-12-23 MED ORDER — FOLIC ACID 1 MG PO TABS: 1.0000 mg | ORAL_TABLET | Freq: Every day | ORAL | Status: DC

## 2013-12-23 NOTE — Assessment & Plan Note (Signed)
5 minutes today spent counseling patient on unhealthy effects of continued tobacco abuse and encouragement of cessation including medical options available to help the patient quit smoking. 

## 2013-12-23 NOTE — Progress Notes (Signed)
Pre visit review using our clinic review tool, if applicable. No additional management support is needed unless otherwise documented below in the visit note. 

## 2013-12-23 NOTE — Assessment & Plan Note (Signed)
Prior use of goody powder and ibuprofen PTA 01/2013 hosp for PN R>L knee symptoms - Advised scheduled tylenol with tramadol prn pain - refill rx done Refer for sports med eval to consider steroid injection as needed  Also xray today

## 2013-12-23 NOTE — Progress Notes (Signed)
and to her your is a little  Subjective:    Patient ID: Deborah Cabrera, female    DOB: March 07, 1961, 53 y.o.   MRN: [id number]  HPI  Patient is here for follow up  Reviewed chronic medical issues and interval medical events  Past Medical History  Diagnosis Date  . Uterine fibroids     dx 01/2013 by [place] in setting of severe iron def anemia  . Thrombocytopenia 01/16/13    in setting of pyelonephritis hosp, 1 plt pk transfusion when plt 27K  . Pyelonephritis 01/16/2013    R PN -Ecoli - hosp for same  . Anemia     Hgb 4 01/2013 due to menometorhagia  . Menometrorrhagia     Review of Systems  Respiratory: Negative for cough and shortness of breath.   Cardiovascular: Negative for chest pain and leg swelling.  Musculoskeletal: Positive for arthralgias (R>L knee) and joint swelling.       Objective:   Physical Exam  BP 138/84  Pulse 69  Temp(Src) 98.2 F (36.8 C) (Oral)  Ht 5\' 6"  (1.676 m)  Wt 183 lb (83.008 kg)  BMI 29.55 kg/m2  SpO2 98% Wt Readings from Last 3 Encounters:  12/23/13 183 lb (83.008 kg)  12/19/13 184 lb (83.462 kg)  05/04/13 178 lb 12.8 oz (81.103 kg)   Constitutional: She appears well-developed and well-nourished. No distress.  Neck: Normal range of motion. Neck supple. No JVD present. No thyromegaly present.  Cardiovascular: Normal rate, regular rhythm and normal heart sounds.  No murmur heard. No BLE edema. Pulmonary/Chest: Effort normal and breath sounds normal. No respiratory distress. She has no wheezes.  MSkel: R>L knee - boggy synovitis - tender to palpation over joint line; FROM and ligamentous function intact Psychiatric: She has a normal mood and affect. Her behavior is normal. Judgment and thought content normal.   Lab Results  Component Value Date   WBC 6.8 01/30/2013   HGB 11.5* 01/30/2013   HCT 36.0 01/30/2013   PLT 685.0* 01/30/2013   GLUCOSE 82 01/30/2013   CHOL 134 01/30/2013   TRIG 121.0 01/30/2013   HDL 65.00 01/30/2013   LDLCALC 45 01/30/2013    ALT 13 01/17/2013   AST 19 01/17/2013   NA 142 01/30/2013   K 4.1 01/30/2013   CL 107 01/30/2013   CREATININE 0.7 01/30/2013   BUN 11 01/30/2013   CO2 27 01/30/2013   TSH 0.94 01/30/2013   INR 1.14 01/18/2013    Ct Head Wo Contrast  01/17/2013   CLINICAL DATA:  53 year old female with severe headache. Pain. Anemia with thrombocytopenia.  EXAM: CT HEAD WITHOUT CONTRAST  TECHNIQUE: Contiguous axial images were obtained from the base of the skull through the vertex without intravenous contrast.  COMPARISON:  07/27/2005.  FINDINGS: Small left maxillary sinus mucous retention cysts partially visible. Other Visualized paranasal sinuses and mastoids are clear. No acute osseous abnormality identified. Visualized orbit soft tissues are within normal limits. Visualized scalp soft tissues are within normal limits.  Cerebral volume is normal. No midline shift, ventriculomegaly, mass effect, evidence of mass lesion, intracranial hemorrhage or evidence of cortically based acute infarction. Gray-white matter differentiation is within normal limits throughout the brain. No suspicious intracranial vascular hyperdensity.  IMPRESSION: Stable and normal noncontrast CT appearance of the brain.   Electronically Signed   By: Lars Pinks M.D.   On: 01/17/2013 12:53   US Transvaginal Non-ob  01/16/2013   CLINICAL DATA:  Menorrhagia.  EXAM: TRANSABDOMINAL AND TRANSVAGINAL ULTRASOUND OF  PELVIS  TECHNIQUE: Both transabdominal and transvaginal ultrasound examinations of the pelvis were performed. Transabdominal technique was performed for global imaging of the pelvis including uterus, ovaries, adnexal regions, and pelvic cul-de-sac. It was necessary to proceed with endovaginal exam following the transabdominal exam to visualize the endometrium and ovaries.  COMPARISON:  None  FINDINGS: Uterus  Measurements: 7.0 x 4.1 x 6.1 cm. Diffusely heterogeneous myometrial echotexture. 2 fibroids are seen in the lower uterine segment and posterior  corpus which have submucosal components impinging upon the endometrial cavity. These measure 2.6 cm and 2.5 cm in maximum diameters respectively. A small posterior subserosal fibroid is seen which measures 1.0 cm.  Endometrium  Thickness: 5 mm excluding small amount of fluid seen in endometrial cavity.  Right ovary  Measurements: 2.1 x 1.2 x 1.8 cm. Normal appearance/no adnexal mass.  Left ovary  Measurements: 1.6 x 1.0 x 1.6 cm. Normal appearance/no adnexal mass.  Other findings  A small amount of free fluid is seen.  IMPRESSION: Several small uterine fibroids, 2 of which are submucosal in location.  Unremarkable ovaries. No adnexal mass identified.   Electronically Signed   By: Earle Gell   On: 01/16/2013 15:58   US Pelvis Complete  01/16/2013   CLINICAL DATA:  Menorrhagia.  EXAM: TRANSABDOMINAL AND TRANSVAGINAL ULTRASOUND OF PELVIS  TECHNIQUE: Both transabdominal and transvaginal ultrasound examinations of the pelvis were performed. Transabdominal technique was performed for global imaging of the pelvis including uterus, ovaries, adnexal regions, and pelvic cul-de-sac. It was necessary to proceed with endovaginal exam following the transabdominal exam to visualize the endometrium and ovaries.  COMPARISON:  None  FINDINGS: Uterus  Measurements: 7.0 x 4.1 x 6.1 cm. Diffusely heterogeneous myometrial echotexture. 2 fibroids are seen in the lower uterine segment and posterior corpus which have submucosal components impinging upon the endometrial cavity. These measure 2.6 cm and 2.5 cm in maximum diameters respectively. A small posterior subserosal fibroid is seen which measures 1.0 cm.  Endometrium  Thickness: 5 mm excluding small amount of fluid seen in endometrial cavity.  Right ovary  Measurements: 2.1 x 1.2 x 1.8 cm. Normal appearance/no adnexal mass.  Left ovary  Measurements: 1.6 x 1.0 x 1.6 cm. Normal appearance/no adnexal mass.  Other findings  A small amount of free fluid is seen.  IMPRESSION: Several  small uterine fibroids, 2 of which are submucosal in location.  Unremarkable ovaries. No adnexal mass identified.   Electronically Signed   By: Earle Gell   On: 01/16/2013 15:58   Ct Abdomen Pelvis W Contrast  01/16/2013   CLINICAL DATA:  Anemia. Evaluate for mass. Right flank pain.  EXAM: CT ABDOMEN AND PELVIS WITH CONTRAST  TECHNIQUE: Multidetector CT imaging of the abdomen and pelvis was performed using the standard protocol following bolus administration of intravenous contrast.  CONTRAST:  5mL OMNIPAQUE IOHEXOL 300 MG/ML  SOLN  COMPARISON:  None.  FINDINGS: BODY WALL: Unremarkable.  LOWER CHEST:  Mediastinum: Trace pericardial fluid.  Lungs/pleura: No consolidation.  ABDOMEN/PELVIS:  Liver: No focal abnormality.  Biliary: No evidence of biliary obstruction or stone.  Pancreas: Unremarkable.  Spleen: Unremarkable.  Adrenals: Unremarkable.  Kidneys and ureters: Best seen on the coronal projection, there is patchy hypo enhancement of the right renal cortex, most notably affecting the lower pole. No urinary obstruction or stone. No abscess. Bladder: Circumferential mural thickening.  Bowel: No obstruction. Normal appendix.  Retroperitoneum: No mass or adenopathy.  Peritoneum: Small volume free fluid in the low pelvis.  Reproductive: Small uterine  fibroids better seen by recent pelvic sonography.  Vascular: No acute abnormality.  OSSEOUS: Thoracolumbar levoscoliosis with accelerated degenerative disc and facet disease. 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 Bilateral hip osteoarthritis.  IMPRESSION: Findings suggestive of urinary tract infection, both cystitis and right pyelonephritis. No urinary obstruction or calculus.   Electronically Signed   By: Jorje Guild   On: 01/16/2013 21:08   Dg Chest Port 1 View  01/16/2013   CLINICAL DATA:  Chest pain and shortness of Breath.  EXAM: PORTABLE CHEST - 1 VIEW  COMPARISON:  07/27/2005.  FINDINGS: The heart size and mediastinal contours are within normal  limits given the AP projection. Both lungs are clear. The visualized skeletal structures are unremarkable.  IMPRESSION: No active disease.   Electronically Signed   By: Kalman Jewels M.D.   On: 01/16/2013 17:44       Assessment & Plan:   Problem List Items Addressed This Visit   Arthritis of knee - Primary     Prior use of goody powder and ibuprofen PTA 01/2013 hosp for PN R>L knee symptoms - Advised scheduled tylenol with tramadol prn pain - refill rx done Refer for sports med eval to consider steroid injection as needed  Also xray today    Relevant Medications      traMADol (ULTRAM) 50 MG tablet   Other Relevant Orders      DG Knee Complete 4 Views Left      DG Knee Complete 4 Views Right      Ambulatory referral to Sports Medicine   Hypertension      BP Readings from Last 3 Encounters:  12/23/13 138/84  12/19/13 132/98  05/04/13 158/110   Reviewed uncontrolled BP during colo screen 03/2013 +FH same Started lorsartan 50mg  qd 04/2013 -improved- The current medical regimen is effective;  continue present plan and medications.  Pt will monitor at home and call if SBP>140     Tobacco use disorder     5 minutes today spent counseling patient on unhealthy effects of continued tobacco abuse and encouragement of cessation including medical options available to help the patient quit smoking.     Other Visit Diagnoses   Need for prophylactic vaccination and inoculation against influenza

## 2013-12-23 NOTE — Patient Instructions (Signed)
It was good to see you today.  We have reviewed your prior records including labs and tests today  Flu shot updated today  xray ordered today. Your results will be released to [facility] (or called to you) after review, usually within 72hours after test completion. If any changes need to be made, you will be notified at that same time.  Medications reviewed and updated, no changes recommended at this time. Refill on medication(s) as discussed today.  we'll make referral to Dr Tamala Julian at our office for evaluation and treatment of your knee pain  Please schedule followup in 3-4 months for annual labs, call sooner if problems.

## 2013-12-23 NOTE — Assessment & Plan Note (Signed)
BP Readings from Last 3 Encounters:  12/23/13 138/84  12/19/13 132/98  05/04/13 158/110   Reviewed uncontrolled BP during colo screen 03/2013 +FH same Started lorsartan 50mg  qd 04/2013 -improved- The current medical regimen is effective;  continue present plan and medications.  Pt will monitor at home and call if SBP>140

## 2014-01-01 ENCOUNTER — Ambulatory Visit (INDEPENDENT_AMBULATORY_CARE_PROVIDER_SITE_OTHER): Payer: BC Managed Care – PPO | Admitting: Family Medicine

## 2014-01-01 ENCOUNTER — Other Ambulatory Visit (INDEPENDENT_AMBULATORY_CARE_PROVIDER_SITE_OTHER): Payer: BC Managed Care – PPO

## 2014-01-01 ENCOUNTER — Encounter: Payer: Self-pay | Admitting: Family Medicine

## 2014-01-01 VITALS — BP 142/86 | HR 87 | Ht 68.0 in | Wt 183.0 lb

## 2014-01-01 DIAGNOSIS — M1710 Unilateral primary osteoarthritis, unspecified knee: Secondary | ICD-10-CM | POA: Insufficient documentation

## 2014-01-01 DIAGNOSIS — M25569 Pain in unspecified knee: Secondary | ICD-10-CM

## 2014-01-01 DIAGNOSIS — M25562 Pain in left knee: Principal | ICD-10-CM

## 2014-01-01 DIAGNOSIS — M25561 Pain in right knee: Secondary | ICD-10-CM

## 2014-01-01 NOTE — Assessment & Plan Note (Addendum)
Patient's pain seems to be moderate. Not stopping her from any regular daily activities but does hurt her with such easy activities such as going up or down stairs. Patient does have a goal of playing sports with her children which she is unable to do at this time. Patient had injections today. Patient was given a brace for her left knee which was the patellofemoral brace. We discussed an icing regimen, home exercises, and over-the-counter medications that could be beneficial. Patient will try these interventions and come back and see me again in 3-4 weeks. Patient continues to have pain we can consider formal physical therapy, as well as viscus supplementation.

## 2014-01-01 NOTE — Progress Notes (Signed)
[facility] [address], [place] [address] Phone: [phone] Subjective:    I'm seeing this patient by the request  of:  Gwendolyn Grant, MD   CC: Bilateral knee pain  Deborah Cabrera is a 53 y.o. female coming in with complaint of bilateral knee pain. Patient states that she's had this pain for quite some time. Seems to be worsening. Patient states that it seems to be him worse with certain activities especially going up or down stairs. Notices a pain more on the lateral aspect of the knees. Can have some dull aching sensation as well as the medial aspect of her knees. States that she can of a dull throbbing ache at night he can keep her out but does not seem to wake her up. Denies any clicking popping or giving out on her. Patient has tried Tylenol with no significant improvement. Patient was the severity of 7/10. X-rays were reviewed by me today. Patient is a mild to moderate osteophytic changes of the knee bilaterally. Seems to be tricompartmental    Past medical history, social, surgical and family history all reviewed in electronic medical record.   Review of Systems: No headache, visual changes, nausea, vomiting, diarrhea, constipation, dizziness, abdominal pain, skin rash, fevers, chills, night sweats, weight loss, swollen lymph nodes, body aches, joint swelling, muscle aches, chest pain, shortness of breath, mood changes.   Objective Blood pressure 142/86, pulse 87, height 5\' 8"  (1.727 m), weight 183 lb (83.008 kg), SpO2 98.00%.  General: No apparent distress alert and oriented x3 mood and affect normal, dressed appropriately.  HEENT: Pupils equal, extraocular movements intact  Respiratory: Patient's speak in full sentences and does not appear short of breath  Cardiovascular: No lower extremity edema, non tender, no erythema  Skin: Warm dry intact with no signs of infection or rash on extremities or on axial skeleton.    Abdomen: Soft nontender  Neuro: Cranial nerves II through XII are intact, neurovascularly intact in all extremities with 2+ DTRs and 2+ pulses.  Lymph: No lymphadenopathy of posterior or anterior cervical chain or axillae bilaterally.  Gait normal with good balance and coordination.  MSK:  Non tender with full range of motion and good stability and symmetric strength and tone of shoulders, elbows, wrist, hip,  and ankles bilaterally.   Knee: Bilaterally Patient actually has mild mild valgus deformity bilaterally. Patient also has atrophy of the VMO bilaterally with lateral tracking of the patella. Tenderness to palpation over the patella especially the superior lateral aspect bilaterally. Mild medial joint line tenderness ROM full in flexion and extension and lower leg rotation. Ligaments with solid consistent endpoints including ACL, PCL, LCL, MCL. Negative Mcmurray's, Apley's, and Thessalonian tests.  painful patellar compression. Patellar glide with moderate crepitus. Patellar and quadriceps tendons unremarkable. Hamstring and quadriceps strength is normal.   MSK US performed of: Bilateral This study was ordered, performed, and interpreted by Charlann Boxer D.O.  Knee: All structures visualized. Patient does have some degenerative changes of the meniscal bilaterally but no specific acute tear. Mild to moderate narrowing of the joint space of the medial joint line bilaterally. Moderate to severe narrowing of the superior lateral patellofemoral or joint bilaterally right greater than left. Patellar Tendon unremarkable on long and transverse views without effusion. No abnormality of prepatellar bursa. LCL and MCL unremarkable on long and transverse views. No abnormality of origin of medial or lateral head of the gastrocnemius.  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 Right  knee was prepped with alcohol swab and utilizing anterolateral approach, patient's right knee space was injected with 4:1  marcaine 0.5%: Kenalog 40mg /dL. Patient tolerated the procedure well without immediate complications.  After informed written and verbal consent, patient was seated on exam table. Left knee was prepped with alcohol swab and utilizing anterolateral approach, patient's left knee space was injected with 4:1  marcaine 0.5%: Kenalog 40mg /dL. Patient tolerated the procedure well without immediate complications.   Impression and Recommendations:     This case required medical decision making of moderate complexity.

## 2014-01-01 NOTE — Patient Instructions (Signed)
Great to meet you Ice 20 minutes 2 times daily. Usually after activity and before bed. Exercises 3 times a week.  Wear brace with activity.  Take tylenol 500 mg three times a day is the best evidence based medicine we have for arthritis.  Glucosamine sulfate 750mg  twice a day is a supplement that has been shown to help moderate to severe arthritis. Vitamin D 2000 IU daily Fish oil 2 grams daily.  Tumeric 500mg  twice daily.  Capsaicin topically up to four times a day may also help with pain. Cortisone injections are an option if these interventions do not seem to make a difference or need more relief. We did this today.  If cortisone injections do not help, there are different types of shots that may help but they take longer to take effect.  We can discuss this at follow up.  It's important that you continue to stay active. Controlling your weight is important.  Consider physical therapy to strengthen muscles around the joint that hurts to take pressure off of the joint itself. Shoe inserts with good arch support may be helpful.  [employer] orthotics at [employer] [facility] could help.  Water aerobics and cycling with low resistance are the best two types of exercise for arthritis. Come back and see me in 3-4 weeks.

## 2014-01-29 ENCOUNTER — Ambulatory Visit: Payer: BC Managed Care – PPO | Admitting: Family Medicine

## 2014-02-04 ENCOUNTER — Other Ambulatory Visit: Payer: Self-pay | Admitting: Internal Medicine

## 2014-02-05 ENCOUNTER — Ambulatory Visit (INDEPENDENT_AMBULATORY_CARE_PROVIDER_SITE_OTHER): Payer: BC Managed Care – PPO | Admitting: Family Medicine

## 2014-02-05 ENCOUNTER — Encounter: Payer: Self-pay | Admitting: Family Medicine

## 2014-02-05 VITALS — BP 132/86 | HR 85 | Ht 68.0 in | Wt 185.0 lb

## 2014-02-05 DIAGNOSIS — M1710 Unilateral primary osteoarthritis, unspecified knee: Secondary | ICD-10-CM

## 2014-02-05 NOTE — Patient Instructions (Signed)
Good to see you Try the new brace on the right knee Can use pennsaid up to 2 times daily Continue exercises 3 times a week Ice is your friend Deborah Cabrera in 2 months

## 2014-02-05 NOTE — Progress Notes (Signed)
  [facility] [address] [place], [place] [address] Phone: [phone] Subjective:     CC: Bilateral knee pain followup  Deborah Cabrera is a 53 y.o. female coming in with complaint of bilateral knee pain. 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 Patient states that after the injections as well as the bracing and  home exercises. Patient states that she is approximately 90% better. Still having some mild discomfort when she stands on it for greater than 10 hours. Patient states that it is a dull throbbing pain still. Denies any radiation down the legs any numbness.    Past medical history, social, surgical and family history all reviewed in electronic medical record.   Review of Systems: No headache, visual changes, nausea, vomiting, diarrhea, constipation, dizziness, abdominal pain, skin rash, fevers, chills, night sweats, weight loss, swollen lymph nodes, body aches, joint swelling, muscle aches, chest pain, shortness of breath, mood changes.   Objective Blood pressure 132/86, pulse 85, height 5\' 8"  (1.727 m), weight 185 lb (83.915 kg), SpO2 99.00%.  General: No apparent distress alert and oriented x3 mood and affect normal, dressed appropriately.  HEENT: Pupils equal, extraocular movements intact  Respiratory: Patient's speak in full sentences and does not appear short of breath  Cardiovascular: No lower extremity edema, non tender, no erythema  Skin: Warm dry intact with no signs of infection or rash on extremities or on axial skeleton.  Abdomen: Soft nontender  Neuro: Cranial nerves II through XII are intact, neurovascularly intact in all extremities with 2+ DTRs and 2+ pulses.  Lymph: No lymphadenopathy of posterior or anterior cervical chain or axillae bilaterally.  Gait normal with good balance and coordination.  MSK:  Non tender with full range of motion and good stability and symmetric  strength and tone of shoulders, elbows, wrist, hip,  and ankles bilaterally.   Knee: Bilaterally Patient actually has mild mild valgus deformity bilaterally. Patient also has atrophy of the VMO bilaterally with lateral tracking of the patella. No change from previous exam Less tenderness but still present on the medial joint lines bilaterally. ROM full in flexion and extension and lower leg rotation. Ligaments with solid consistent endpoints including ACL, PCL, LCL, MCL. Negative Mcmurray's, Apley's, and Thessalonian tests.  painful patellar compression. Patellar glide with moderate crepitus. Patellar and quadriceps tendons unremarkable. Hamstring and quadriceps strength is normal.     Impression and Recommendations:     This case required medical decision making of moderate complexity.

## 2014-02-05 NOTE — Assessment & Plan Note (Addendum)
Patient is making significant improvements with conservative therapy. Patient was fitted with a brace for her knee today. I also discussed continuing the icing and exercises and showed proper lifting technique. Patient is to continue with the interventions overdoing at this time and follow up with me in 2 months. We can repeat cortical steroid injections every 3-4 months as needed. Hopefully that this would not be needed. Patient would be a candidate for viscous supplementation if at all needed.  Spent greater than 25 minutes with patient face-to-face and had greater than 50% of counseling including as described above in assessment and plan.

## 2014-02-14 ENCOUNTER — Emergency Department (HOSPITAL_COMMUNITY)
Admission: EM | Admit: 2014-02-14 | Discharge: 2014-02-14 | Disposition: A | Discharge status: Home / Self Care | Payer: BC Managed Care – PPO | Attending: Emergency Medicine | Admitting: Emergency Medicine

## 2014-02-14 ENCOUNTER — Encounter (HOSPITAL_COMMUNITY): Payer: Self-pay | Admitting: Emergency Medicine

## 2014-02-14 DIAGNOSIS — Z720 Tobacco use: Secondary | ICD-10-CM | POA: Insufficient documentation

## 2014-02-14 DIAGNOSIS — Z79899 Other long term (current) drug therapy: Secondary | ICD-10-CM | POA: Insufficient documentation

## 2014-02-14 DIAGNOSIS — M25561 Pain in right knee: Secondary | ICD-10-CM | POA: Diagnosis present

## 2014-02-14 DIAGNOSIS — Z8742 Personal history of other diseases of the female genital tract: Secondary | ICD-10-CM | POA: Insufficient documentation

## 2014-02-14 DIAGNOSIS — D649 Anemia, unspecified: Secondary | ICD-10-CM | POA: Diagnosis not present

## 2014-02-14 MED ORDER — HYDROCODONE-ACETAMINOPHEN 5-325 MG PO TABS: 1.0000 | ORAL_TABLET | Freq: Four times a day (QID) | ORAL | Status: DC | PRN

## 2014-02-14 MED ORDER — IBUPROFEN 800 MG PO TABS
800.0000 mg | ORAL_TABLET | Freq: Three times a day (TID) | ORAL | Status: DC
Start: 2014-02-14 — End: 2014-02-24

## 2014-02-14 NOTE — ED Notes (Signed)
Pt reports chronic knee pain. R/knee pain increased over last 24 hours. Denies trauma

## 2014-02-14 NOTE — ED Provider Notes (Signed)
CSN: [id number]     Arrival date & time 02/14/14  1034 History   First MD Initiated Contact with Patient 02/14/14 1041     No chief complaint on file.    (Consider location/radiation/quality/duration/timing/severity/associated sxs/prior Treatment) HPI Comments: Pt states that she has been having ongoing bilateral knee pain over the last couple of months. Pt states that she had joint injections about 1 months ago and she had been doing a little bit better with wearing the braces. Pt state that in the last couple of days her right knee seems to have gotten worse. Denies any injury. No abnormal swelling, redness or warmth noted.  The history is provided by the patient. No language interpreter was used.    Past Medical History  Diagnosis Date  . Uterine fibroids     dx 01/2013 by [place] in setting of severe iron def anemia  . Thrombocytopenia 01/16/13    in setting of pyelonephritis hosp, 1 plt pk transfusion when plt 27K  . Pyelonephritis 01/16/2013    R PN -Ecoli - hosp for same  . Anemia     Hgb 4 01/2013 due to menometorhagia  . Menometrorrhagia    Past Surgical History  Procedure Laterality Date  . Tubal ligation Bilateral    Family History  Problem Relation Age of Onset  . Hypertension Mother   . Kidney failure Father     ESRD since age 39  . Diabetes Mellitus II Son   . Osteoarthritis Mother   . Hyperlipidemia Mother   . Diabetes Sister   . Ovarian cancer Sister 42    died of same age 42  . Dementia Paternal Grandmother   . Stroke Maternal Grandmother   . Colon cancer Neg Hx   . Alcohol abuse Father    History  Substance Use Topics  . Smoking status: Current Some Day Smoker    Types: Cigarettes  . Smokeless tobacco: Not on file  . Alcohol Use: Yes     Comment: Socially   OB History   Grav Para Term Preterm Abortions TAB SAB Ect Mult Living                 Review of Systems  Constitutional: Negative.   Respiratory: Negative.   Cardiovascular: Negative.        Allergies  Review of patient's allergies indicates no known allergies.  Home Medications   Prior to Admission medications   Medication Sig Start Date End Date Taking? Authorizing Provider  acetaminophen (TYLENOL) 325 MG tablet Take 650 mg by mouth every 6 (six) hours as needed for pain.    Historical Provider, MD  ferrous gluconate (FERGON) 324 MG tablet Take 1 tablet (324 mg total) by mouth 3 (three) times daily with meals. 01/19/13   Ripudeep Krystal Eaton, MD  folic acid (FOLVITE) 1 MG tablet Take 1 tablet (1 mg total) by mouth daily. 12/23/13   Rowe Clack, MD  losartan (COZAAR) 50 MG tablet Take 1 tablet (50 mg total) by mouth daily. 05/04/13   Rowe Clack, MD  nitrofurantoin, macrocrystal-monohydrate, (MACROBID) 100 MG capsule Take 1 capsule (100 mg total) by mouth 2 (two) times daily. 12/19/13   Eulas Post, MD  traMADol (ULTRAM) 50 MG tablet Take 1 tablet (50 mg total) by mouth every 6 (six) hours as needed. 12/23/13   Rowe Clack, MD   There were no vitals taken for this visit. Physical Exam  Nursing note and vitals reviewed. Constitutional: She is oriented to  person, place, and time. She appears well-developed and well-nourished.  Cardiovascular: Normal rate.   Pulmonary/Chest: Effort normal and breath sounds normal.  Musculoskeletal: Normal range of motion.  Pt has full rom. Tenderness on the lateral aspect of the right knee. No redness or warmth noted  Neurological: She is alert and oriented to person, place, and time.  Skin: Skin is warm and dry.  Psychiatric: She has a normal mood and affect.    ED Course  Procedures (including critical care time) Labs Review Labs Reviewed - No data to display  Imaging Review No results found.   EKG Interpretation None      MDM   Final diagnoses:  Right knee pain    Pt has had imaging in the last 2 months. Don't think further imaging is needed at this time.will have take ibuprofen and hydrocodone  and follow up with ortho. Pt instructed on stopping ultram while on hydrocodone    Glendell Docker, NP 02/14/14 1059

## 2014-02-14 NOTE — ED Provider Notes (Signed)
Medical screening examination/treatment/procedure(s) were performed by non-physician practitioner and as supervising physician I was immediately available for consultation/collaboration.   EKG Interpretation None      Rolland Porter, MD, Abram Sander   Janice Norrie, MD 02/14/14 1102

## 2014-02-14 NOTE — Discharge Instructions (Signed)
Don't take the ultram while taking the hydrocodone Arthralgia Your caregiver has diagnosed you as suffering from an arthralgia. Arthralgia means there is pain in a joint. This can come from many reasons including:  Bruising the joint which causes soreness (inflammation) in the joint.  Wear and tear on the joints which occur as we grow older (osteoarthritis).  Overusing the joint.  Various forms of arthritis.  Infections of the joint. Regardless of the cause of pain in your joint, most of these different pains respond to anti-inflammatory drugs and rest. The exception to this is when a joint is infected, and these cases are treated with antibiotics, if it is a bacterial infection. HOME CARE INSTRUCTIONS   Rest the injured area for as long as directed by your caregiver. Then slowly start using the joint as directed by your caregiver and as the pain allows. Crutches as directed may be useful if the ankles, knees or hips are involved. If the knee was splinted or casted, continue use and care as directed. If an stretchy or elastic wrapping bandage has been applied today, it should be removed and re-applied every 3 to 4 hours. It should not be applied tightly, but firmly enough to keep swelling down. Watch toes and feet for swelling, bluish discoloration, coldness, numbness or excessive pain. If any of these problems (symptoms) occur, remove the ace bandage and re-apply more loosely. If these symptoms persist, contact your caregiver or return to this location.  For the first 24 hours, keep the injured extremity elevated on pillows while lying down.  Apply ice for 15-20 minutes to the sore joint every couple hours while awake for the first half day. Then 03-04 times per day for the first 48 hours. Put the ice in a plastic bag and place a towel between the bag of ice and your skin.  Wear any splinting, casting, elastic bandage applications, or slings as instructed.  Only take over-the-counter or  prescription medicines for pain, discomfort, or fever as directed by your caregiver. Do not use aspirin immediately after the injury unless instructed by your physician. Aspirin can cause increased bleeding and bruising of the tissues.  If you were given crutches, continue to use them as instructed and do not resume weight bearing on the sore joint until instructed. Persistent pain and inability to use the sore joint as directed for more than 2 to 3 days are warning signs indicating that you should see a caregiver for a follow-up visit as soon as possible. Initially, a hairline fracture (break in bone) may not be evident on X-rays. Persistent pain and swelling indicate that further evaluation, non-weight bearing or use of the joint (use of crutches or slings as instructed), or further X-rays are indicated. X-rays may sometimes not show a small fracture until a week or 10 days later. Make a follow-up appointment with your own caregiver or one to whom we have referred you. A radiologist (specialist in reading X-rays) may read your X-rays. Make sure you know how you are to obtain your X-ray results. Do not assume everything is normal if you do not hear from [place]. SEEK MEDICAL CARE IF: Bruising, swelling, or pain increases. SEEK IMMEDIATE MEDICAL CARE IF:   Your fingers or toes are numb or blue.  The pain is not responding to medications and continues to stay the same or get worse.  The pain in your joint becomes severe.  You develop a fever over 102 F (38.9 C).  It becomes impossible to move  or use the joint. MAKE SURE YOU:   Understand these instructions.  Will watch your condition.  Will get help right away if you are not doing well or get worse. Document Released: 04/23/2005 Document Revised: 07/16/2011 Document Reviewed: 12/10/2007 [facility] Patient Information 2015 [place], [place]. This information is not intended to replace advice given to you by your health care provider. Make sure you  discuss any questions you have with your health care provider.

## 2014-02-18 ENCOUNTER — Ambulatory Visit (INDEPENDENT_AMBULATORY_CARE_PROVIDER_SITE_OTHER): Payer: BC Managed Care – PPO | Admitting: Family Medicine

## 2014-02-18 ENCOUNTER — Encounter: Payer: Self-pay | Admitting: Family Medicine

## 2014-02-18 ENCOUNTER — Other Ambulatory Visit: Payer: BC Managed Care – PPO

## 2014-02-18 ENCOUNTER — Other Ambulatory Visit (INDEPENDENT_AMBULATORY_CARE_PROVIDER_SITE_OTHER): Payer: BC Managed Care – PPO

## 2014-02-18 VITALS — BP 130/86 | HR 96 | Ht 68.0 in | Wt 183.0 lb

## 2014-02-18 DIAGNOSIS — M25561 Pain in right knee: Secondary | ICD-10-CM

## 2014-02-18 DIAGNOSIS — M67361 Transient synovitis, right knee: Secondary | ICD-10-CM

## 2014-02-18 MED ORDER — DOXYCYCLINE HYCLATE 100 MG PO TABS: 100.0000 mg | ORAL_TABLET | Freq: Two times a day (BID) | ORAL | Status: AC

## 2014-02-18 MED ORDER — MELOXICAM 15 MG PO TABS: 15.0000 mg | ORAL_TABLET | Freq: Every day | ORAL | Status: DC

## 2014-02-18 NOTE — Progress Notes (Signed)
[facility] [place],  [address] Phone: [phone] Subjective:     CC: Bilateral knee pain followup  [id number] Deborah Cabrera is a 53 y.o. female coming in with complaint of bilateral knee pain. 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 Patient was given home exercises previously and conservative therapy and was doing significantly well. Patient was seen previously 2 weeks ago and was doing relatively well. Unfortunately 3 days ago started having severe pain and had to go to the emergency room. Patient states that she had significant swelling of the knee. Patient has not of her any true injury at all. Patient denies any fevers or chills or any abnormal weight loss. Patient denies any history of gout. Patient denies any new medications at this time. Does not know what exactly has caused swelling or pain does notice that it continues to give her difficulty. Pain is waking her up at night and not allowing her to ambulate well.    Past medical history, social, surgical and family history all reviewed in electronic medical record.   Review of Systems: No headache, visual changes, nausea, vomiting, diarrhea, constipation, dizziness, abdominal pain, skin rash, fevers, chills, night sweats, weight loss, swollen lymph nodes, body aches, joint swelling, muscle aches, chest pain, shortness of breath, mood changes.   Objective Blood pressure 130/86, pulse 96, height 5\' 8"  (1.727 m), weight 183 lb (83.008 kg), SpO2 98.00%.  General: No apparent distress alert and oriented x3 mood and affect normal, dressed appropriately.  HEENT: Pupils equal, extraocular movements intact  Respiratory: Patient's speak in full sentences and does not appear short of breath  Cardiovascular: No lower extremity edema, non tender, no erythema  Skin: Warm dry intact with no signs of infection or rash on extremities or on axial  skeleton.  Abdomen: Soft nontender  Neuro: Cranial nerves II through XII are intact, neurovascularly intact in all extremities with 2+ DTRs and 2+ pulses.  Lymph: No lymphadenopathy of posterior or anterior cervical chain or axillae bilaterally.  Gait normal with good balance and coordination.  MSK:  Non tender with full range of motion and good stability and symmetric strength and tone of shoulders, elbows, wrist, hip,  and ankles bilaterally.   Knee: Right knee Patient does have +1 effusion and does have decreased range of motion lacking last 5 of extension and only flexion to 95. Ligaments with solid consistent endpoints including ACL, PCL, LCL, MCL. Negative Mcmurray's, Apley's, and Thessalonian tests.  painful patellar compression. Patellar glide with moderate crepitus. Patellar and quadriceps tendons unremarkable. Hamstring and quadriceps strength is normal.  Contralateral knee unremarkable  MSK US performed of: Right knee This study was ordered, performed, and interpreted by Charlann Boxer D.O.  Knee: All structures visualized. Patient did have a +1 effusion which also had a synovitis Anteromedial, anterolateral, posteromedial, and posterolateral menisci unremarkable without tearing, fraying, effusion, or displacement. Patellar Tendon unremarkable on long and transverse views without effusion. No abnormality of prepatellar bursa. LCL and MCL unremarkable on long and transverse views. No abnormality of origin of medial or lateral head of the gastrocnemius.  IMPRESSION:  Right knee effusion with synovitis  Procedure: Real-time Ultrasound Guided aspiration and Injection of right knee Device: GE Logiq E  Ultrasound guided injection is preferred based studies that show increased duration, increased effect, greater accuracy, decreased procedural pain, increased response rate, and decreased cost with ultrasound guided versus blind injection.  Verbal informed consent  obtained.    Time-out conducted.  Noted no overlying erythema, induration, or other signs of local infection.  Skin prepped in a sterile fashion.  Local anesthesia: Topical Ethyl chloride.  With sterile technique and under real time ultrasound guidance: With a 22-gauge 2 inch needle patient was injected with 4 cc of 0.5% Marcaine and then had removal of 15 cc of cloudy colored synovial fluid removed and 1 cc of Kenalog 40 mg/dL injected. This was from a superior lateral approach.  Completed without difficulty  Pain immediately resolved suggesting accurate placement of the medication.  Advised to call if fevers/chills, erythema, induration, drainage, or persistent bleeding.  Images permanently stored and available for review in the ultrasound unit.  Impression: Technically successful ultrasound guided injection.      Impression and Recommendations:     This case required medical decision making of moderate complexity.

## 2014-02-18 NOTE — Assessment & Plan Note (Signed)
I believe the patient does have more of a transient synovitis of the right knee. Patient does not state that she has been sick recently. Patient does have cloudy removal of the synovial fluid today so I will cover with antibiotics in case this is a bacterial infection. 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 We discussed icing protocol. Patient did have the aspiration done and was able to move knees significantly more. We will send synovial fluid down for evaluation. Patient is going to come back though again next week for further evaluation. At that time is continuing to have difficulty advanced imaging may be necessary.  Spent greater than 25 minutes with patient face-to-face and had greater than 50% of counseling including as described above in assessment and plan.

## 2014-02-18 NOTE — Patient Instructions (Signed)
Good to see you Only wear brace if it helps Meloxicam daily for 10 days then as needed Doxycycline 100mg  twice daily for next week.  We will send the fluid to lab and no news is good news.  See me in 1 week or call me Monday if worse.

## 2014-02-19 LAB — SYNOVIAL CELL COUNT + DIFF, W/ CRYSTALS
CRYSTALS FLUID: NONE SEEN
Eosinophils-Synovial: 0 % (ref 0–1)
Lymphocytes-Synovial Fld: 97 % — ABNORMAL HIGH (ref 0–20)
Monocyte/Macrophage: 1 % — ABNORMAL LOW (ref 50–90)
Neutrophil, Synovial: 2 % (ref 0–25)
WBC, Synovial: 975 cu mm — ABNORMAL HIGH (ref 0–200)

## 2014-02-24 ENCOUNTER — Encounter: Payer: Self-pay | Admitting: Family Medicine

## 2014-02-24 ENCOUNTER — Ambulatory Visit (INDEPENDENT_AMBULATORY_CARE_PROVIDER_SITE_OTHER): Payer: BC Managed Care – PPO | Admitting: Family Medicine

## 2014-02-24 VITALS — BP 142/84 | HR 79 | Ht 68.0 in | Wt 189.0 lb

## 2014-02-24 DIAGNOSIS — M1710 Unilateral primary osteoarthritis, unspecified knee: Secondary | ICD-10-CM

## 2014-02-24 DIAGNOSIS — M25561 Pain in right knee: Secondary | ICD-10-CM

## 2014-02-24 DIAGNOSIS — M67361 Transient synovitis, right knee: Secondary | ICD-10-CM

## 2014-02-24 DIAGNOSIS — M129 Arthropathy, unspecified: Secondary | ICD-10-CM

## 2014-02-24 MED ORDER — IBUPROFEN 800 MG PO TABS: 800.0000 mg | ORAL_TABLET | Freq: Three times a day (TID) | ORAL | Status: DC

## 2014-02-24 NOTE — Assessment & Plan Note (Addendum)
The patient continues to have a small effusion as well as a moderate synovitis of the knee on ultrasound. Patient does have enlargement and calcific changes of a Baker cyst that could be contributing. Due to patient's pain and ambulation I would really like to get an MRI to further evaluate. Patient's x-rays have showed moderate arthritic changes but we need to rule out any intra-articular pathology such as a meniscal tear or any type of infectious etiology that could be contributing to this pain. Patient will come back one to 2 days after the MRI and we will discuss findings. In the interim patient will continue with the icing as well as the oral anti-inflammatories. Patient and depending on findings we will change her treatment as appropriate. Patient will continue the antibiotics at this time as well.

## 2014-02-24 NOTE — Patient Instructions (Signed)
Good to see you Ice is your friend MRI is ordered and they will call you/.  Come back 1-2 days afterward and we will see what is going on.  Refilled motrin today.

## 2014-02-24 NOTE — Progress Notes (Signed)
[facility] [place], [place] [address] Phone: [phone] Subjective:     CC: Right knee synovitis followup  [id number] Deborah Cabrera is a 53 y.o. female coming in with complaint of right knee pain. Patient does have a past medical history significant for moderate osteoarthritic changes bilaterally. Patient did come in with right knee pain and did have a transient synovitis of the right knee. Fluid did have a cloudy appearance but only 975 white blood cells. Patient did have aspiration and injection done. Patient also has been on doxycycline. Patient states that it has improved in pain but continues to have difficulty with ambulation as well as cannot bend it entirely. Patient also states that straightening seems to hurt even more. Patient denies any radiation of the legs and denies any fevers or chills or any abnormal weight loss. Patient states though that she can do her daily activities she finds it difficult. Sometimes the pain does wake up her at night. Patient has been taking ibuprofen and continues once again the antibiotics.    Past medical history, social, surgical and family history all reviewed in electronic medical record.   Review of Systems: No headache, visual changes, nausea, vomiting, diarrhea, constipation, dizziness, abdominal pain, skin rash, fevers, chills, night sweats, weight loss, swollen lymph nodes, body aches, joint swelling, muscle aches, chest pain, shortness of breath, mood changes.   Objective Blood pressure 142/84, pulse 79, height 5\' 8"  (1.727 m), weight 189 lb (85.73 kg), SpO2 99.00%.  General: No apparent distress alert and oriented x3 mood and affect normal, dressed appropriately.  HEENT: Pupils equal, extraocular movements intact  Respiratory: Patient's speak in full sentences and does not appear short of breath  Cardiovascular: No lower extremity edema, non tender, no erythema  Skin: Warm dry intact with no  signs of infection or rash on extremities or on axial skeleton.  Abdomen: Soft nontender  Neuro: Cranial nerves II through XII are intact, neurovascularly intact in all extremities with 2+ DTRs and 2+ pulses.  Lymph: No lymphadenopathy of posterior or anterior cervical chain or axillae bilaterally.  Gait normal with good balance and coordination.  MSK:  Non tender with full range of motion and good stability and symmetric strength and tone of shoulders, elbows, wrist, hip,  and ankles bilaterally.   Knee: Right knee Patient does have trace effusion and does have decreased range of motion lacking last 5 of extension and only flexion to 95. Ligaments with solid consistent endpoints including ACL, PCL, LCL, MCL. Negative Mcmurray's, Apley's, and Thessalonian tests.  painful patellar compression. Patellar glide with moderate crepitus. Patellar and quadriceps tendons unremarkable. Hamstring and quadriceps strength is normal.  Continued tenderness as well. Contralateral knee unremarkable  MSK US performed of: Right knee This study was ordered, performed, and interpreted by Charlann Boxer D.O.  Knee: All structures visualized. Patient did have a trace effusion which also had a synovitis that seems to be worse Anteromedial, anterolateral, posteromedial, and posterolateral menisci unremarkable without tearing, fraying, effusion, or displacement. Once again looked in great detail and no specific tearing noted. Patellar Tendon unremarkable on long and transverse views without effusion. No abnormality of prepatellar bursa. LCL and MCL unremarkable on long and transverse views. No abnormality of origin of medial or lateral head of the gastrocnemius. He persists is noted with calcific changes.  IMPRESSION:  Right knee effusion improved but continued synovitis and loculated Baker cyst with calcific changes noted     Impression and Recommendations:  This case required medical decision making of  moderate complexity.

## 2014-02-26 ENCOUNTER — Ambulatory Visit: Payer: BC Managed Care – PPO | Admitting: Family Medicine

## 2014-03-02 ENCOUNTER — Ambulatory Visit
Admission: RE | Admit: 2014-03-02 | Discharge: 2014-03-02 | Disposition: A | Discharge status: Home / Self Care | Payer: BC Managed Care – PPO | Source: Ambulatory Visit | Attending: Family Medicine | Admitting: Family Medicine

## 2014-03-02 DIAGNOSIS — M67361 Transient synovitis, right knee: Secondary | ICD-10-CM

## 2014-03-02 DIAGNOSIS — M25561 Pain in right knee: Secondary | ICD-10-CM

## 2014-03-04 ENCOUNTER — Telehealth: Payer: Self-pay | Admitting: Internal Medicine

## 2014-03-04 DIAGNOSIS — M2341 Loose body in knee, right knee: Secondary | ICD-10-CM

## 2014-03-04 NOTE — Telephone Encounter (Signed)
Discussed with patient mri results.  With loose bodies in knee think patient will make more improvement with possible surgical intervention. I believe an arthroscopic procedure could be beneficial for patient. Patient would like to discuss this with a surgeon to know what her choices would be. Patient will be referred today

## 2014-03-04 NOTE — Telephone Encounter (Signed)
Pt states she received call with MRI results but message is muffled and cannot understand what was said. Pt is also wondering if she needs to keep December appt with Dr Tamala Julian.

## 2014-03-09 ENCOUNTER — Telehealth: Payer: Self-pay | Admitting: Internal Medicine

## 2014-03-09 NOTE — Telephone Encounter (Signed)
I do not have much else to help her with, likely needs surgery.

## 2014-03-09 NOTE — Telephone Encounter (Signed)
Pt does not want to see a orthopedic at this time but would rather follow up with Dr Tamala Julian. Pt does not need a referral at this time.

## 2014-03-24 ENCOUNTER — Other Ambulatory Visit: Payer: Self-pay | Admitting: Family Medicine

## 2014-03-24 NOTE — Telephone Encounter (Signed)
Refill done.  

## 2014-04-07 ENCOUNTER — Ambulatory Visit: Payer: BC Managed Care – PPO | Admitting: Family Medicine

## 2014-04-13 ENCOUNTER — Ambulatory Visit (INDEPENDENT_AMBULATORY_CARE_PROVIDER_SITE_OTHER): Payer: BC Managed Care – PPO | Admitting: Family Medicine

## 2014-04-13 ENCOUNTER — Encounter: Payer: Self-pay | Admitting: Family Medicine

## 2014-04-13 VITALS — BP 126/82 | HR 96 | Ht 67.0 in | Wt 189.0 lb

## 2014-04-13 DIAGNOSIS — M1710 Unilateral primary osteoarthritis, unspecified knee: Secondary | ICD-10-CM

## 2014-04-13 NOTE — Patient Instructions (Addendum)
Good to see you You are doing well at moment Continue the medicines we have done.  Ice is your friend at night.  See me when you need me.  If you ever decide to have it cleaned out give me a call

## 2014-04-13 NOTE — Progress Notes (Signed)
  [facility] [place], [place] [address] Phone: [phone] Subjective:     CC: Right knee synovitis followup  [id number] Deborah Cabrera is a 53 y.o. female coming in with complaint of right knee pain. Patient was seen previously for his synovitis of the knee. Patient was not responding to conservative therapy so she did have an MRI of the knee. 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 Patient was to be sent for surgical discussion. Patient states that her knee has started to feel somewhat better though and would like to delay the surgery. Patient states she is having no mechanical symptoms at this time. Patient states that she is able to do more activity. Patient continues on the meloxicam daily as well as some topical anti-inflammatories when needed. Continues to try to do the exercises fairly regularly.    Past medical history, social, surgical and family history all reviewed in electronic medical record.   Review of Systems: No headache, visual changes, nausea, vomiting, diarrhea, constipation, dizziness, abdominal pain, skin rash, fevers, chills, night sweats, weight loss, swollen lymph nodes, body aches, joint swelling, muscle aches, chest pain, shortness of breath, mood changes.   Objective Blood pressure 126/82, pulse 96, height 5\' 7"  (1.702 m), weight 189 lb (85.73 kg), SpO2 97 %.  General: No apparent distress alert and oriented x3 mood and affect normal, dressed appropriately.  HEENT: Pupils equal, extraocular movements intact  Respiratory: Patient's speak in full sentences and does not appear short of breath  Cardiovascular: No lower extremity edema, non tender, no erythema  Skin: Warm dry intact with no signs of infection or rash on extremities or on axial  skeleton.  Abdomen: Soft nontender  Neuro: Cranial nerves II through XII are intact, neurovascularly intact in all extremities with 2+ DTRs and 2+ pulses.  Lymph: No lymphadenopathy of posterior or anterior cervical chain or axillae bilaterally.  Gait normal with good balance and coordination.  MSK:  Non tender with full range of motion and good stability and symmetric strength and tone of shoulders, elbows, wrist, hip,  and ankles bilaterally.   Knee: Right knee Full range of motion today. Ligaments with solid consistent endpoints including ACL, PCL, LCL, MCL. Negative Mcmurray's, Apley's, and Thessalonian tests.  painful patellar compression. Patellar glide with moderate crepitus. Patellar and quadriceps tendons unremarkable. Hamstring and quadriceps strength is normal.  Continued tenderness as well. Contralateral knee unremarkable     Impression and Recommendations:     This case required medical decision making of moderate complexity.

## 2014-04-13 NOTE — Assessment & Plan Note (Signed)
Patient does have moderate osteophytic changes of multiple loose bodies as well as a osteochondromatosis. Patient is responding to oral anti-inflammatories at this time. We may when to consider surgical intervention at some point but patient states that she is doing okay at this time and once a continue to monitor.

## 2014-04-23 ENCOUNTER — Other Ambulatory Visit: Payer: Self-pay | Admitting: Family Medicine

## 2014-04-23 NOTE — Telephone Encounter (Signed)
Refilled meloxicam & denied ibuprofen per Dr. Tamala Julian.

## 2014-05-15 ENCOUNTER — Other Ambulatory Visit: Payer: Self-pay | Admitting: Internal Medicine

## 2014-06-10 ENCOUNTER — Other Ambulatory Visit: Payer: Self-pay | Admitting: Family Medicine

## 2014-06-29 ENCOUNTER — Ambulatory Visit (INDEPENDENT_AMBULATORY_CARE_PROVIDER_SITE_OTHER): Payer: BLUE CROSS/BLUE SHIELD | Admitting: Internal Medicine

## 2014-06-29 ENCOUNTER — Other Ambulatory Visit (INDEPENDENT_AMBULATORY_CARE_PROVIDER_SITE_OTHER): Payer: BLUE CROSS/BLUE SHIELD

## 2014-06-29 ENCOUNTER — Encounter: Payer: Self-pay | Admitting: Internal Medicine

## 2014-06-29 VITALS — BP 140/82 | HR 74 | Temp 98.0°F | Ht 67.0 in | Wt 187.0 lb

## 2014-06-29 DIAGNOSIS — F17200 Nicotine dependence, unspecified, uncomplicated: Secondary | ICD-10-CM

## 2014-06-29 DIAGNOSIS — I10 Essential (primary) hypertension: Secondary | ICD-10-CM

## 2014-06-29 DIAGNOSIS — Z0000 Encounter for general adult medical examination without abnormal findings: Secondary | ICD-10-CM

## 2014-06-29 DIAGNOSIS — Z23 Encounter for immunization: Secondary | ICD-10-CM

## 2014-06-29 DIAGNOSIS — E663 Overweight: Secondary | ICD-10-CM

## 2014-06-29 DIAGNOSIS — Z720 Tobacco use: Secondary | ICD-10-CM

## 2014-06-29 LAB — LIPID PANEL
CHOLESTEROL: 236 mg/dL — AB (ref 0–200)
HDL: 142.1 mg/dL (ref >39.00)
LDL CALC: 76 mg/dL (ref 0–99)
NonHDL: 93.9
TRIGLYCERIDES: 89 mg/dL (ref 0.0–149.0)
Total CHOL/HDL Ratio: 2
VLDL: 17.8 mg/dL (ref 0.0–40.0)

## 2014-06-29 LAB — URINALYSIS, ROUTINE W REFLEX MICROSCOPIC
Bilirubin Urine: NEGATIVE
KETONES UR: 15 — AB
Leukocytes, UA: NEGATIVE
Nitrite: NEGATIVE
SPECIFIC GRAVITY, URINE: 1.015 (ref 1.000–1.030)
Urine Glucose: NEGATIVE
Urobilinogen, UA: 0.2 (ref 0.0–1.0)
WBC UA: NONE SEEN (ref 0–?)
pH: 6 (ref 5.0–8.0)

## 2014-06-29 LAB — CBC WITH DIFFERENTIAL/PLATELET
Basophils Absolute: 0 10*3/uL (ref 0.0–0.1)
Basophils Relative: 0.5 % (ref 0.0–3.0)
EOS ABS: 0 10*3/uL (ref 0.0–0.7)
EOS PCT: 1 % (ref 0.0–5.0)
HCT: 46.7 % — ABNORMAL HIGH (ref 36.0–46.0)
Hemoglobin: 15.7 g/dL — ABNORMAL HIGH (ref 12.0–15.0)
LYMPHS ABS: 1.1 10*3/uL (ref 0.7–4.0)
Lymphocytes Relative: 33.6 % (ref 12.0–46.0)
MCHC: 33.7 g/dL (ref 30.0–36.0)
MCV: 89.8 fl (ref 78.0–100.0)
Monocytes Absolute: 0.3 10*3/uL (ref 0.1–1.0)
Monocytes Relative: 7.9 % (ref 3.0–12.0)
Neutro Abs: 1.8 10*3/uL (ref 1.4–7.7)
Neutrophils Relative %: 57 % (ref 43.0–77.0)
PLATELETS: 176 10*3/uL (ref 150.0–400.0)
RBC: 5.2 Mil/uL — ABNORMAL HIGH (ref 3.87–5.11)
RDW: 16.3 % — AB (ref 11.5–15.5)
WBC: 3.2 10*3/uL — ABNORMAL LOW (ref 4.0–10.5)

## 2014-06-29 LAB — HEPATIC FUNCTION PANEL
ALT: 24 U/L (ref 0–35)
AST: 33 U/L (ref 0–37)
Albumin: 4.6 g/dL (ref 3.5–5.2)
Alkaline Phosphatase: 140 U/L — ABNORMAL HIGH (ref 39–117)
BILIRUBIN TOTAL: 1.2 mg/dL (ref 0.2–1.2)
Bilirubin, Direct: 0.2 mg/dL (ref 0.0–0.3)
Total Protein: 7.6 g/dL (ref 6.0–8.3)

## 2014-06-29 LAB — BASIC METABOLIC PANEL
BUN: 10 mg/dL (ref 6–23)
CALCIUM: 10 mg/dL (ref 8.4–10.5)
CO2: 28 mEq/L (ref 19–32)
CREATININE: 0.58 mg/dL (ref 0.40–1.20)
Chloride: 99 mEq/L (ref 96–112)
GFR: 140.36 mL/min (ref >60.00)
Glucose, Bld: 93 mg/dL (ref 70–99)
Potassium: 4.1 mEq/L (ref 3.5–5.1)
Sodium: 135 mEq/L (ref 135–145)

## 2014-06-29 LAB — TSH: TSH: 2.4 u[IU]/mL (ref 0.35–4.50)

## 2014-06-29 MED ORDER — MELOXICAM 15 MG PO TABS: 15.0000 mg | ORAL_TABLET | Freq: Every day | ORAL | Status: DC

## 2014-06-29 NOTE — Patient Instructions (Addendum)
It was good to see you today.  We have reviewed your prior records including labs and tests today  Health Maintenance reviewed - Tdap (tetnus, diptheria and pertussis) updated today - good for 10 years. All other recommended immunizations and age-appropriate screenings are up-to-date.  Test(s) ordered today. Your results will be released to [place] (or called to you) after review, usually within 72hours after test completion. If any changes need to be made, you will be notified at that same time.  Medications reviewed and updated, no changes recommended at this time.  Monitor your blood pressure and call us if over 140/85 to adjust your medications as needed  Work on lifestyle changes as discussed (low fat, low carb, increased protein diet; improved exercise efforts; weight loss) to control sugar, blood pressure and cholesterol levels and/or reduce risk of developing other medical problems. Look into [email or web address].[email or web address] or other type of food journal to assist you in this process.  Please schedule followup in 12 months for annual exam and labs, call sooner if problems.  Health Maintenance Adopting a healthy lifestyle and getting preventive care can go a long way to promote health and wellness. Talk with your health care provider about what schedule of regular examinations is right for you. This is a good chance for you to check in with your provider about disease prevention and staying healthy. In between checkups, there are plenty of things you can do on your own. Experts have done a lot of research about which lifestyle changes and preventive measures are most likely to keep you healthy. Ask your health care provider for more information. WEIGHT AND DIET  Eat a healthy diet  Be sure to include plenty of vegetables, fruits, low-fat dairy products, and lean protein.  Do not eat a lot of foods high in solid fats, added sugars, or salt.  Get regular exercise. This is one of the most important  things you can do for your health.  Most adults should exercise for at least 150 minutes each week. The exercise should increase your heart rate and make you sweat (moderate-intensity exercise).  Most adults should also do strengthening exercises at least twice a week. This is in addition to the moderate-intensity exercise.  Maintain a healthy weight  Body mass index (BMI) is a measurement that can be used to identify possible weight problems. It estimates body fat based on height and weight. Your health care provider can help determine your BMI and help you achieve or maintain a healthy weight.  For females 29 years of age and older:   A BMI below 18.5 is considered underweight.  A BMI of 18.5 to 24.9 is normal.  A BMI of 25 to 29.9 is considered overweight.  A BMI of 30 and above is considered obese.  Watch levels of cholesterol and blood lipids  You should start having your blood tested for lipids and cholesterol at 52 years of age, then have this test every 5 years.  You may need to have your cholesterol levels checked more often if:  Your lipid or cholesterol levels are high.  You are older than 52 years of age.  You are at high risk for heart disease.  CANCER SCREENING   Lung Cancer  Lung cancer screening is recommended for adults 15-6 years old who are at high risk for lung cancer because of a history of smoking.  A yearly low-dose CT scan of the lungs is recommended for people who:  Currently smoke.  Have quit within  the past 15 years.  Have at least a 30-pack-year history of smoking. A pack year is smoking an average of one pack of cigarettes a day for 1 year.  Yearly screening should continue until it has been 15 years since you quit.  Yearly screening should stop if you develop a health problem that would prevent you from having lung cancer treatment.  Breast Cancer  Practice breast self-awareness. This means understanding how your breasts normally  appear and feel.  It also means doing regular breast self-exams. Let your health care provider know about any changes, no matter how small.  If you are in your 20s or 30s, you should have a clinical breast exam (CBE) by a health care provider every 1-3 years as part of a regular health exam.  If you are 56 or older, have a CBE every year. Also consider having a breast X-ray (mammogram) every year.  If you have a family history of breast cancer, talk to your health care provider about genetic screening.  If you are at high risk for breast cancer, talk to your health care provider about having an MRI and a mammogram every year.  Breast cancer gene (BRCA) assessment is recommended for women who have family members with BRCA-related cancers. BRCA-related cancers include:  Breast.  Ovarian.  Tubal.  Peritoneal cancers.  Results of the assessment will determine the need for genetic counseling and BRCA1 and BRCA2 testing. Cervical Cancer Routine pelvic examinations to screen for cervical cancer are no longer recommended for nonpregnant women who are considered low risk for cancer of the pelvic organs (ovaries, uterus, and vagina) and who do not have symptoms. A pelvic examination may be necessary if you have symptoms including those associated with pelvic infections. Ask your health care provider if a screening pelvic exam is right for you.   The Pap test is the screening test for cervical cancer for women who are considered at risk.  If you had a hysterectomy for a problem that was not cancer or a condition that could lead to cancer, then you no longer need Pap tests.  If you are older than 65 years, and you have had normal Pap tests for the past 10 years, you no longer need to have Pap tests.  If you have had past treatment for cervical cancer or a condition that could lead to cancer, you need Pap tests and screening for cancer for at least 20 years after your treatment.  If you no  longer get a Pap test, assess your risk factors if they change (such as having a new sexual partner). This can affect whether you should start being screened again.  Some women have medical problems that increase their chance of getting cervical cancer. If this is the case for you, your health care provider may recommend more frequent screening and Pap tests.  The human papillomavirus (HPV) test is another test that may be used for cervical cancer screening. The HPV test looks for the virus that can cause cell changes in the cervix. The cells collected during the Pap test can be tested for HPV.  The HPV test can be used to screen women 46 years of age and older. Getting tested for HPV can extend the interval between normal Pap tests from three to five years.  An HPV test also should be used to screen women of any age who have unclear Pap test results.  After 52 years of age, women should have HPV testing as often  as Pap tests.  Colorectal Cancer  This type of cancer can be detected and often prevented.  Routine colorectal cancer screening usually begins at 52 years of age and continues through 52 years of age.  Your health care provider may recommend screening at an earlier age if you have risk factors for colon cancer.  Your health care provider may also recommend using home test kits to check for hidden blood in the stool.  A small camera at the end of a tube can be used to examine your colon directly (sigmoidoscopy or colonoscopy). This is done to check for the earliest forms of colorectal cancer.  Routine screening usually begins at age 82.  Direct examination of the colon should be repeated every 5-10 years through 52 years of age. However, you may need to be screened more often if early forms of precancerous polyps or small growths are found. Skin Cancer  Check your skin from head to toe regularly.  Tell your health care provider about any new moles or changes in moles,  especially if there is a change in a mole's shape or color.  Also tell your health care provider if you have a mole that is larger than the size of a pencil eraser.  Always use sunscreen. Apply sunscreen liberally and repeatedly throughout the day.  Protect yourself by wearing long sleeves, pants, a wide-brimmed hat, and sunglasses whenever you are outside. HEART DISEASE, DIABETES, AND HIGH BLOOD PRESSURE   Have your blood pressure checked at least every 1-2 years. High blood pressure causes heart disease and increases the risk of stroke.  If you are between 75 years and 40 years old, ask your health care provider if you should take aspirin to prevent strokes.  Have regular diabetes screenings. This involves taking a blood sample to check your fasting blood sugar level.  If you are at a normal weight and have a low risk for diabetes, have this test once every three years after 52 years of age.  If you are overweight and have a high risk for diabetes, consider being tested at a younger age or more often. PREVENTING INFECTION  Hepatitis B  If you have a higher risk for hepatitis B, you should be screened for this virus. You are considered at high risk for hepatitis B if:  You were born in a country where hepatitis B is common. Ask your health care provider which countries are considered high risk.  Your parents were born in a high-risk country, and you have not been immunized against hepatitis B (hepatitis B vaccine).  You have HIV or AIDS.  You use needles to inject street drugs.  You live with someone who has hepatitis B.  You have had sex with someone who has hepatitis B.  You get hemodialysis treatment.  You take certain medicines for conditions, including cancer, organ transplantation, and autoimmune conditions. Hepatitis C  Blood testing is recommended for:  Everyone born from [birth year] through 1963.  Anyone with known risk factors for hepatitis C. Sexually transmitted  infections (STIs)  You should be screened for sexually transmitted infections (STIs) including gonorrhea and chlamydia if:  You are sexually active and are younger than 52 years of age.  You are older than 52 years of age and your health care provider tells you that you are at risk for this type of infection.  Your sexual activity has changed since you were last screened and you are at an increased risk for chlamydia or gonorrhea. Ask  your health care provider if you are at risk.  If you do not have HIV, but are at risk, it may be recommended that you take a prescription medicine daily to prevent HIV infection. This is called pre-exposure prophylaxis (PrEP). You are considered at risk if:  You are sexually active and do not regularly use condoms or know the HIV status of your partner(s).  You take drugs by injection.  You are sexually active with a partner who has HIV. Talk with your health care provider about whether you are at high risk of being infected with HIV. If you choose to begin PrEP, you should first be tested for HIV. You should then be tested every 3 months for as long as you are taking PrEP.  PREGNANCY   If you are premenopausal and you may become pregnant, ask your health care provider about preconception counseling.  If you may become pregnant, take 400 to 800 micrograms (mcg) of folic acid every day.  If you want to prevent pregnancy, talk to your health care provider about birth control (contraception). OSTEOPOROSIS AND MENOPAUSE   Osteoporosis is a disease in which the bones lose minerals and strength with aging. This can result in serious bone fractures. Your risk for osteoporosis can be identified using a bone density scan.  If you are 37 years of age or older, or if you are at risk for osteoporosis and fractures, ask your health care provider if you should be screened.  Ask your health care provider whether you should take a calcium or vitamin D supplement to  lower your risk for osteoporosis.  Menopause may have certain physical symptoms and risks.  Hormone replacement therapy may reduce some of these symptoms and risks. Talk to your health care provider about whether hormone replacement therapy is right for you.  HOME CARE INSTRUCTIONS   Schedule regular health, dental, and eye exams.  Stay current with your immunizations.   Do not use any tobacco products including cigarettes, chewing tobacco, or electronic cigarettes.  If you are pregnant, do not drink alcohol.  If you are breastfeeding, limit how much and how often you drink alcohol.  Limit alcohol intake to no more than 1 drink per day for nonpregnant women. One drink equals 12 ounces of beer, 5 ounces of wine, or 1 ounces of hard liquor.  Do not use street drugs.  Do not share needles.  Ask your health care provider for help if you need support or information about quitting drugs.  Tell your health care provider if you often feel depressed.  Tell your health care provider if you have ever been abused or do not feel safe at home. Document Released: 11/06/2010 Document Revised: 09/07/2013 Document Reviewed: 03/25/2013 [facility] Patient Information 2015 [place], [place]. This information is not intended to replace advice given to you by your health care provider. Make sure you discuss any questions you have with your health care provider. Hypertension Hypertension is another name for high blood pressure. High blood pressure forces your heart to work harder to pump blood. A blood pressure reading has two numbers, which includes a higher number over a lower number (example: 110/72). HOME CARE   Have your blood pressure rechecked by your doctor.  Only take medicine as told by your doctor. Follow the directions carefully. The medicine does not work as well if you skip doses. Skipping doses also puts you at risk for problems.  Do not smoke.  Monitor your blood pressure at home as  told by your doctor. GET HELP IF:  You think you are having a reaction to the medicine you are taking.  You have repeat headaches or feel dizzy.  You have puffiness (swelling) in your ankles.  You have trouble with your vision. GET HELP RIGHT AWAY IF:   You get a very bad headache and are confused.  You feel weak, numb, or faint.  You get chest or belly (abdominal) pain.  You throw up (vomit).  You cannot breathe very well. MAKE SURE YOU:   Understand these instructions.  Will watch your condition.  Will get help right away if you are not doing well or get worse. Document Released: 10/10/2007 Document Revised: 04/28/2013 Document Reviewed: 02/13/2013 [facility] Patient Information 2015 [place], [place]. This information is not intended to replace advice given to you by your health care provider. Make sure you discuss any questions you have with your health care provider.

## 2014-06-29 NOTE — Progress Notes (Signed)
Subjective:    Patient ID: Deborah Cabrera, female    DOB: [birth year], 54 y.o.   MRN: [id number]  HPI  patient is here today for annual physical. Patient feels well overall. Also reviewed chronic medical issues and events   Past Medical History  Diagnosis Date  . Uterine fibroids     dx 01/2013 by [place] in setting of severe iron def anemia  . Thrombocytopenia 01/16/13    in setting of pyelonephritis hosp, 1 plt pk transfusion when plt 27K  . Pyelonephritis 01/16/2013    R PN -Ecoli - hosp for same  . Anemia     Hgb 4 01/2013 due to menometorhagia  . Menometrorrhagia    Family History  Problem Relation Age of Onset  . Hypertension Mother   . Kidney failure Father     ESRD since age 51  . Diabetes Mellitus II Son   . Osteoarthritis Mother   . Hyperlipidemia Mother   . Diabetes Sister   . Ovarian cancer Sister 78    died of same age 80  . Dementia Paternal Grandmother   . Stroke Maternal Grandmother 102  . Colon cancer Neg Hx   . Alcohol abuse Father    History  Substance Use Topics  . Smoking status: Current Some Day Smoker    Types: Cigarettes  . Smokeless tobacco: Not on file  . Alcohol Use: 0.0 oz/week    0 Standard drinks or equivalent per week     Comment: Socially    Review of Systems  Constitutional: Negative for fatigue and unexpected weight change.  Respiratory: Negative for cough, shortness of breath and wheezing.   Cardiovascular: Negative for chest pain, palpitations and leg swelling.  Gastrointestinal: Negative for nausea, abdominal pain and diarrhea.  Skin:       Thinning hair, diffuse on scalp x 6 mo  Neurological: Negative for dizziness, weakness, light-headedness and headaches.  Psychiatric/Behavioral: Negative for dysphoric mood. The patient is not nervous/anxious.   All other systems reviewed and are negative.      Objective:    Physical Exam  Constitutional: She is oriented to person, place, and time. She appears well-developed and  well-nourished. No distress.  overweight  HENT:  Head: Normocephalic and atraumatic.  Right Ear: External ear normal.  Left Ear: External ear normal.  Nose: Nose normal.  Mouth/Throat: Oropharynx is clear and moist. No oropharyngeal exudate.  Eyes: EOM are normal. Pupils are equal, round, and reactive to light. Right eye exhibits no discharge. Left eye exhibits no discharge. No scleral icterus.  Neck: Normal range of motion. Neck supple. No JVD present. No tracheal deviation present. No thyromegaly present.  Cardiovascular: Normal rate, regular rhythm, normal heart sounds and intact distal pulses.  Exam reveals no friction rub.   No murmur heard. Pulmonary/Chest: Effort normal and breath sounds normal. No respiratory distress. She has no wheezes. She has no rales. She exhibits no tenderness.  Abdominal: Soft. Bowel sounds are normal. She exhibits no distension and no mass. There is no tenderness. There is no rebound and no guarding.  Genitourinary:  Defer to gyn  Musculoskeletal: Normal range of motion.  No gross deformities  Lymphadenopathy:    She has no cervical adenopathy.  Neurological: She is alert and oriented to person, place, and time. She has normal reflexes. No cranial nerve deficit.  Skin: Skin is warm and dry. No rash noted. She is not diaphoretic. No erythema.  Mild diffuse thinning, no alopecia or scalp inflammation  Psychiatric:  She has a normal mood and affect. Her behavior is normal. Judgment and thought content normal.  Nursing note and vitals reviewed.   BP 140/82 mmHg  Pulse 74  Temp(Src) 98 F (36.7 C) (Oral)  Ht 5\' 7"  (1.702 m)  Wt 187 lb (84.823 kg)  BMI 29.28 kg/m2  SpO2 98%  LMP 12/05/2013 Wt Readings from Last 3 Encounters:  06/29/14 187 lb (84.823 kg)  04/13/14 189 lb (85.73 kg)  02/24/14 189 lb (85.73 kg)    Lab Results  Component Value Date   WBC 6.8 01/30/2013   HGB 11.5* 01/30/2013   HCT 36.0 01/30/2013   PLT 685.0* 01/30/2013   GLUCOSE  82 01/30/2013   CHOL 134 01/30/2013   TRIG 121.0 01/30/2013   HDL 65.00 01/30/2013   LDLCALC 45 01/30/2013   ALT 13 01/17/2013   AST 19 01/17/2013   NA 142 01/30/2013   K 4.1 01/30/2013   CL 107 01/30/2013   CREATININE 0.7 01/30/2013   BUN 11 01/30/2013   CO2 27 01/30/2013   TSH 0.94 01/30/2013   INR 1.14 01/18/2013    Mr Knee Right Wo Contrast  03/02/2014   CLINICAL DATA:  Generalized knee pain for 5 years, getting progressively worse, swelling  EXAM: MRI OF THE RIGHT KNEE WITHOUT CONTRAST  TECHNIQUE: Multiplanar, multisequence MR imaging of the knee was performed. No intravenous contrast was administered.  COMPARISON:  None.  FINDINGS: MENISCI  Medial meniscus:  Intact.  Lateral meniscus:  Intact.  LIGAMENTS  Cruciates:  Intact ACL and PCL.  Collaterals: Medial collateral ligament is intact. Lateral collateral ligament complex is intact.  CARTILAGE  Patellofemoral: There is full-thickness cartilage loss of the lateral patellar facet and lateral trochlea.  Medial: There is partial thickness cartilage loss of the medial femoral condyle and medial tibial plateau with small marginal osteophytes.  Lateral: There is mild partial thickness cartilage loss of the lateral femoral condyle and lateral tibial plateau with marginal osteophytosis.  Joint: There is a moderate joint effusion. There are multiple loose bodies in the posterior lateral joint space. There are loose bodies of varying size is within a large Baker cyst. There is a peripherally calcified 3.1 x 1.8 cm structure within the large Baker cyst which may reflect calcified synovitis.  Popliteal Fossa:  Large Baker's cyst.  Intact popliteus tendon.  Extensor Mechanism:  Intact.  Bones:  No marrow signal abnormality.  No fracture or dislocation.  IMPRESSION: 1. Tricompartmental cartilage abnormalities as described above. 2. Multiple loose bodies within the posterior lateral joint space and within a large Baker cyst most consistent with secondary  synovial osteochondromatosis. There is a peripherally calcified 3.1 x 1.8 cm structure within the large Baker cyst which may reflect calcified synovitis versus atypical loose body.   Electronically Signed   By: Kathreen Devoid   On: 03/02/2014 09:10       Assessment & Plan:   CPX/z00.00 - Patient has been counseled on age-appropriate routine health concerns for screening and prevention. These are reviewed and up-to-date. Immunizations are up-to-date or declined. Labs ordered and reviewed.  Problem List Items Addressed This Visit    Hypertension    BP Readings from Last 3 Encounters:  06/29/14 140/82  04/13/14 126/82  02/24/14 142/84   Reviewed uncontrolled BP during colo screen 03/2013 +FH same Started lorsartan 50mg  qd 04/2013 -improved- The current medical regimen is effective;  continue present plan and medications.  Pt will monitor at home and call if SBP>140  Overweight    Wt Readings from Last 3 Encounters:  06/29/14 187 lb (84.823 kg)  04/13/14 189 lb (85.73 kg)  02/24/14 189 lb (85.73 kg)   The patient is asked to make an attempt to improve diet and exercise patterns to aid in medical management of this problem.       Tobacco use disorder    5 minutes today spent counseling patient on unhealthy effects of continued tobacco abuse and encouragement of cessation including medical options available to help the patient quit smoking.       Other Visit Diagnoses    Routine general medical examination at a health care facility    -  Primary    Relevant Orders    Basic metabolic panel    CBC with Differential/Platelet    Hepatic function panel    Lipid panel    TSH    Urinalysis, Routine w reflex microscopic        Gwendolyn Grant, MD

## 2014-06-29 NOTE — Assessment & Plan Note (Signed)
5 minutes today spent counseling patient on unhealthy effects of continued tobacco abuse and encouragement of cessation including medical options available to help the patient quit smoking. 

## 2014-06-29 NOTE — Assessment & Plan Note (Signed)
Wt Readings from Last 3 Encounters:  06/29/14 187 lb (84.823 kg)  04/13/14 189 lb (85.73 kg)  02/24/14 189 lb (85.73 kg)   The patient is asked to make an attempt to improve diet and exercise patterns to aid in medical management of this problem.

## 2014-06-29 NOTE — Progress Notes (Signed)
Pre visit review using our clinic review tool, if applicable. No additional management support is needed unless otherwise documented below in the visit note. 

## 2014-06-29 NOTE — Assessment & Plan Note (Signed)
BP Readings from Last 3 Encounters:  06/29/14 140/82  04/13/14 126/82  02/24/14 142/84   Reviewed uncontrolled BP during colo screen 03/2013 +FH same Started lorsartan 50mg  qd 04/2013 -improved- The current medical regimen is effective;  continue present plan and medications.  Pt will monitor at home and call if SBP>140

## 2014-08-12 ENCOUNTER — Other Ambulatory Visit: Payer: Self-pay | Admitting: Internal Medicine

## 2014-11-30 ENCOUNTER — Other Ambulatory Visit: Payer: Self-pay

## 2014-11-30 ENCOUNTER — Other Ambulatory Visit: Payer: Self-pay | Admitting: Internal Medicine

## 2014-11-30 MED ORDER — LOSARTAN POTASSIUM 50 MG PO TABS: 50.0000 mg | ORAL_TABLET | Freq: Every day | ORAL | Status: DC

## 2015-06-30 ENCOUNTER — Ambulatory Visit: Payer: BLUE CROSS/BLUE SHIELD | Admitting: Internal Medicine

## 2015-08-10 ENCOUNTER — Ambulatory Visit: Payer: BLUE CROSS/BLUE SHIELD | Admitting: Internal Medicine

## 2015-08-23 ENCOUNTER — Encounter (INDEPENDENT_AMBULATORY_CARE_PROVIDER_SITE_OTHER): Payer: Self-pay

## 2015-10-04 ENCOUNTER — Ambulatory Visit: Payer: BLUE CROSS/BLUE SHIELD | Admitting: Internal Medicine

## 2015-12-28 ENCOUNTER — Ambulatory Visit (INDEPENDENT_AMBULATORY_CARE_PROVIDER_SITE_OTHER): Payer: BLUE CROSS/BLUE SHIELD | Admitting: Internal Medicine

## 2015-12-28 ENCOUNTER — Encounter: Payer: Self-pay | Admitting: Internal Medicine

## 2015-12-28 VITALS — BP 154/112 | HR 87 | Temp 98.3°F | Resp 16 | Wt 196.0 lb

## 2015-12-28 DIAGNOSIS — F17200 Nicotine dependence, unspecified, uncomplicated: Secondary | ICD-10-CM

## 2015-12-28 DIAGNOSIS — E663 Overweight: Secondary | ICD-10-CM

## 2015-12-28 DIAGNOSIS — I10 Essential (primary) hypertension: Secondary | ICD-10-CM

## 2015-12-28 DIAGNOSIS — R011 Cardiac murmur, unspecified: Secondary | ICD-10-CM

## 2015-12-28 MED ORDER — LOSARTAN POTASSIUM 50 MG PO TABS: 50.0000 mg | ORAL_TABLET | Freq: Every day | ORAL | 1 refills | Status: DC

## 2015-12-28 NOTE — Progress Notes (Signed)
Subjective:    Patient ID: Deborah Cabrera, female    DOB: [birth year], 55 y.o.   MRN: [id number]  HPI She is here to establish with a new pcp.  She is here for follow up.  Hypertension: She is not taking her medication daily - she ran out and did not get it refilled. She is compliant with a low sodium diet.  She denies chest pain, palpitations, edema, shortness of breath and regular headaches. She is not exercising regularly.  She has gained weight. She does not monitor her blood pressure at home.    She is currently smoking, but has cut down. She knows she needs to quit and is current.  Medications and allergies reviewed with patient and updated if appropriate.  Patient Active Problem List   Diagnosis Date Noted  . Overweight 06/29/2014  . Transient synovitis of right knee 02/18/2014  . Primary localized osteoarthrosis, lower leg 01/01/2014  . Hypertension   . Tobacco use disorder   . Arthritis of knee 01/31/2013  . Uterine fibroids 01/19/2013  . Anemia 01/16/2013  . Pyelonephritis 01/16/2013  . Thrombocytopenia ([place]) 01/16/2013    Current Outpatient Prescriptions on File Prior to Visit  Medication Sig Dispense Refill  . losartan (COZAAR) 50 MG tablet Take 1 tablet (50 mg total) by mouth daily. (Patient not taking: Reported on 12/28/2015) 90 tablet 1  . meloxicam (MOBIC) 15 MG tablet Take 1 tablet (15 mg total) by mouth daily. (Patient not taking: Reported on 12/28/2015) 30 tablet 3  . Multiple Vitamin (MULTIVITAMIN WITH MINERALS) TABS tablet Take 1 tablet by mouth daily.    . traMADol (ULTRAM) 50 MG tablet Take 1 tablet (50 mg total) by mouth every 6 (six) hours as needed for severe pain. (Patient not taking: Reported on 12/28/2015) 30 tablet 5   No current facility-administered medications on file prior to visit.     Past Medical History:  Diagnosis Date  . Anemia    Hgb 4 01/2013 due to menometorhagia  . Menometrorrhagia   . Pyelonephritis 01/16/2013   R PN -Ecoli - hosp  for same  . Thrombocytopenia ([place]) 01/16/13   in setting of pyelonephritis hosp, 1 plt pk transfusion when plt 27K  . Uterine fibroids    dx 01/2013 by [place] in setting of severe iron def anemia    Past Surgical History:  Procedure Laterality Date  . TUBAL LIGATION Bilateral     Social History   Social History  . Marital status: Single    Spouse name: N/A  . Number of children: N/A  . Years of education: N/A   Occupational History  . 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 Smoking status: Current Some Day Smoker    Types: Cigarettes  . Smokeless tobacco: None  . Alcohol use 0.0 oz/week     Comment: Socially  . Drug use: No  . Sexual activity: Yes    Birth control/ protection: None   Other Topics Concern  . None   Social History Narrative   Lives with 34 yo son - grown 63yo dtr in [facility]   Single   employed as Materials engineer    Family History  Problem Relation Age of Onset  . Hypertension Mother   . Kidney failure Father     ESRD since age 20  . Diabetes Mellitus II Son   . Osteoarthritis Mother   . Hyperlipidemia Mother   . Diabetes Sister   .  Ovarian cancer Sister 64    died of same age 66  . Dementia Paternal Grandmother   . Stroke Maternal Grandmother 33  . Colon cancer Neg Hx   . Alcohol abuse Father     Review of Systems  Constitutional: Negative for fever.  Respiratory: Positive for cough (allergies). Negative for shortness of breath and wheezing.   Cardiovascular: Negative for chest pain, palpitations and leg swelling.  Musculoskeletal: Positive for arthralgias.  Neurological: Positive for headaches (occasional). Negative for dizziness and light-headedness.       Objective:   Vitals:   12/28/15 1415  BP: (!) 154/112  Pulse: 87  Resp: 16  Temp: 98.3 F (36.8 C)   Filed Weights   12/28/15 1415  Weight: 196 lb (88.9 kg)   Body mass index is 30.7 kg/m.   Physical Exam Constitutional: Appears well-developed  and well-nourished. No distress.  HENT:  Head: Normocephalic and atraumatic.  Neck: Neck supple. No tracheal deviation present. No thyromegaly present.  Cardiovascular: Normal rate, regular rhythm and normal heart sounds.   2/6 systolic murmur heard. No carotid bruit  Pulmonary/Chest: Effort normal and breath sounds normal. No respiratory distress. No has no wheezes. No rales.  Musculoskeletal: No edema.  Lymphadenopathy: No cervical adenopathy.  Skin: Skin is warm and dry. Not diaphoretic.  Psychiatric: Normal mood and affect. Behavior is normal.       Assessment & Plan:   See Problem List for Assessment and Plan of chronic medical problems.

## 2015-12-28 NOTE — Progress Notes (Signed)
Pre visit review using our clinic review tool, if applicable. No additional management support is needed unless otherwise documented below in the visit note. 

## 2015-12-28 NOTE — Assessment & Plan Note (Signed)
Ran out of medication and has not taken it for a while Blood pressure elevated here today Restart losartan 50 mg daily Stressed low-sodium diet, regular exercise and weight loss Discussed consequences of uncontrolled hypertension Follow-up in 2 months for a physical exam-we will do complete blood work done

## 2015-12-28 NOTE — Assessment & Plan Note (Signed)
BMI over 30 Stressed regular exercise, decreased portions

## 2015-12-28 NOTE — Assessment & Plan Note (Signed)
Mild systolic murmur We'll check echocardiogram

## 2015-12-28 NOTE — Assessment & Plan Note (Signed)
Stressed smoking cessation She understands the importance of quitting She deferred medication at this time We'll follow up in 2 months at her next appointment

## 2015-12-28 NOTE — Patient Instructions (Addendum)
  Work on weight loss. Start regular exercise.  Low sodium diet.  Stop smoking.   Medications reviewed and updated.  Changes include restarting the losartan for your blood pressure.   Your prescription(s) have been submitted to your pharmacy. Please take as directed and contact our office if you believe you are having problem(s) with the medication(s).  A ultrasound of your heart was ordered.  Please followup in 2 months for a physical exam

## 2016-01-16 ENCOUNTER — Other Ambulatory Visit: Payer: Self-pay

## 2016-01-16 ENCOUNTER — Other Ambulatory Visit: Payer: Self-pay | Admitting: Internal Medicine

## 2016-01-16 ENCOUNTER — Ambulatory Visit (HOSPITAL_COMMUNITY): Payer: BLUE CROSS/BLUE SHIELD | Attending: Internal Medicine

## 2016-01-16 DIAGNOSIS — Z720 Tobacco use: Secondary | ICD-10-CM | POA: Diagnosis not present

## 2016-01-16 DIAGNOSIS — Z6830 Body mass index (BMI) 30.0-30.9, adult: Secondary | ICD-10-CM | POA: Insufficient documentation

## 2016-01-16 DIAGNOSIS — I313 Pericardial effusion (noninflammatory): Secondary | ICD-10-CM | POA: Diagnosis not present

## 2016-01-16 DIAGNOSIS — R011 Cardiac murmur, unspecified: Secondary | ICD-10-CM | POA: Diagnosis not present

## 2016-01-16 DIAGNOSIS — E669 Obesity, unspecified: Secondary | ICD-10-CM | POA: Diagnosis not present

## 2016-01-16 DIAGNOSIS — I10 Essential (primary) hypertension: Secondary | ICD-10-CM

## 2016-01-17 ENCOUNTER — Encounter: Payer: Self-pay | Admitting: Internal Medicine

## 2016-01-17 DIAGNOSIS — I340 Nonrheumatic mitral (valve) insufficiency: Secondary | ICD-10-CM | POA: Insufficient documentation

## 2016-01-17 DIAGNOSIS — I5189 Other ill-defined heart diseases: Secondary | ICD-10-CM | POA: Insufficient documentation

## 2016-03-10 ENCOUNTER — Other Ambulatory Visit: Payer: Self-pay | Admitting: Internal Medicine

## 2016-03-10 MED ORDER — LOSARTAN POTASSIUM 50 MG PO TABS: 50.0000 mg | ORAL_TABLET | Freq: Every day | ORAL | 1 refills | Status: DC

## 2016-03-12 ENCOUNTER — Other Ambulatory Visit: Payer: Self-pay | Admitting: *Deleted

## 2016-03-12 MED ORDER — LOSARTAN POTASSIUM 50 MG PO TABS: 50.0000 mg | ORAL_TABLET | Freq: Every day | ORAL | 0 refills | Status: DC

## 2016-07-04 ENCOUNTER — Encounter: Payer: BLUE CROSS/BLUE SHIELD | Admitting: Internal Medicine

## 2016-07-16 ENCOUNTER — Encounter: Payer: Self-pay | Admitting: Internal Medicine

## 2016-07-16 NOTE — Patient Instructions (Addendum)
Test(s) ordered today. Your results will be released to [place] (or called to you) after review, usually within 72hours after test completion. If any changes need to be made, you will be notified at that same time.  All other Health Maintenance issues reviewed.   All recommended immunizations and age-appropriate screenings are up-to-date or discussed.  Flu immunization administered today.   Medications reviewed and updated.  No changes recommended at this time.  Your prescription(s) have been submitted to your pharmacy. Please take as directed and contact our office if you believe you are having problem(s) with the medication(s).   Please followup in 6 months   Health Maintenance, Female Adopting a healthy lifestyle and getting preventive care can go a long way to promote health and wellness. Talk with your health care provider about what schedule of regular examinations is right for you. This is a good chance for you to check in with your provider about disease prevention and staying healthy. In between checkups, there are plenty of things you can do on your own. Experts have done a lot of research about which lifestyle changes and preventive measures are most likely to keep you healthy. Ask your health care provider for more information. Weight and diet Eat a healthy diet  Be sure to include plenty of vegetables, fruits, low-fat dairy products, and lean protein.  Do not eat a lot of foods high in solid fats, added sugars, or salt.  Get regular exercise. This is one of the most important things you can do for your health.  Most adults should exercise for at least 150 minutes each week. The exercise should increase your heart rate and make you sweat (moderate-intensity exercise).  Most adults should also do strengthening exercises at least twice a week. This is in addition to the moderate-intensity exercise. Maintain a healthy weight  Body mass index (BMI) is a measurement that can  be used to identify possible weight problems. It estimates body fat based on height and weight. Your health care provider can help determine your BMI and help you achieve or maintain a healthy weight.  For females 52 years of age and older:  A BMI below 18.5 is considered underweight.  A BMI of 18.5 to 24.9 is normal.  A BMI of 25 to 29.9 is considered overweight.  A BMI of 30 and above is considered obese. Watch levels of cholesterol and blood lipids  You should start having your blood tested for lipids and cholesterol at 54 years of age, then have this test every 5 years.  You may need to have your cholesterol levels checked more often if:  Your lipid or cholesterol levels are high.  You are older than 54 years of age.  You are at high risk for heart disease. Cancer screening Lung Cancer  Lung cancer screening is recommended for adults 25-9 years old who are at high risk for lung cancer because of a history of smoking.  A yearly low-dose CT scan of the lungs is recommended for people who:  Currently smoke.  Have quit within the past 15 years.  Have at least a 30-pack-year history of smoking. A pack year is smoking an average of one pack of cigarettes a day for 1 year.  Yearly screening should continue until it has been 15 years since you quit.  Yearly screening should stop if you develop a health problem that would prevent you from having lung cancer treatment. Breast Cancer  Practice breast self-awareness. This means understanding how  your breasts normally appear and feel.  It also means doing regular breast self-exams. Let your health care provider know about any changes, no matter how small.  If you are in your 20s or 30s, you should have a clinical breast exam (CBE) by a health care provider every 1-3 years as part of a regular health exam.  If you are 42 or older, have a CBE every year. Also consider having a breast X-ray (mammogram) every year.  If you have a  family history of breast cancer, talk to your health care provider about genetic screening.  If you are at high risk for breast cancer, talk to your health care provider about having an MRI and a mammogram every year.  Breast cancer gene (BRCA) assessment is recommended for women who have family members with BRCA-related cancers. BRCA-related cancers include:  Breast.  Ovarian.  Tubal.  Peritoneal cancers.  Results of the assessment will determine the need for genetic counseling and BRCA1 and BRCA2 testing. Cervical Cancer  Your health care provider may recommend that you be screened regularly for cancer of the pelvic organs (ovaries, uterus, and vagina). This screening involves a pelvic examination, including checking for microscopic changes to the surface of your cervix (Pap test). You may be encouraged to have this screening done every 3 years, beginning at age 41.  For women ages 29-65, health care providers may recommend pelvic exams and Pap testing every 3 years, or they may recommend the Pap and pelvic exam, combined with testing for human papilloma virus (HPV), every 5 years. Some types of HPV increase your risk of cervical cancer. Testing for HPV may also be done on women of any age with unclear Pap test results.  Other health care providers may not recommend any screening for nonpregnant women who are considered low risk for pelvic cancer and who do not have symptoms. Ask your health care provider if a screening pelvic exam is right for you.  If you have had past treatment for cervical cancer or a condition that could lead to cancer, you need Pap tests and screening for cancer for at least 20 years after your treatment. If Pap tests have been discontinued, your risk factors (such as having a new sexual partner) need to be reassessed to determine if screening should resume. Some women have medical problems that increase the chance of getting cervical cancer. In these cases, your health  care provider may recommend more frequent screening and Pap tests. Colorectal Cancer  This type of cancer can be detected and often prevented.  Routine colorectal cancer screening usually begins at 54 years of age and continues through 54 years of age.  Your health care provider may recommend screening at an earlier age if you have risk factors for colon cancer.  Your health care provider may also recommend using home test kits to check for hidden blood in the stool.  A small camera at the end of a tube can be used to examine your colon directly (sigmoidoscopy or colonoscopy). This is done to check for the earliest forms of colorectal cancer.  Routine screening usually begins at age 80.  Direct examination of the colon should be repeated every 5-10 years through 54 years of age. However, you may need to be screened more often if early forms of precancerous polyps or small growths are found. Skin Cancer  Check your skin from head to toe regularly.  Tell your health care provider about any new moles or changes in moles,  especially if there is a change in a mole's shape or color.  Also tell your health care provider if you have a mole that is larger than the size of a pencil eraser.  Always use sunscreen. Apply sunscreen liberally and repeatedly throughout the day.  Protect yourself by wearing long sleeves, pants, a wide-brimmed hat, and sunglasses whenever you are outside. Heart disease, diabetes, and high blood pressure  High blood pressure causes heart disease and increases the risk of stroke. High blood pressure is more likely to develop in:  People who have blood pressure in the high end of the normal range (130-139/85-89 mm Hg).  People who are overweight or obese.  People who are African American.  If you are 57-39 years of age, have your blood pressure checked every 3-5 years. If you are 56 years of age or older, have your blood pressure checked every year. You should have  your blood pressure measured twice-once when you are at a hospital or clinic, and once when you are not at a hospital or clinic. Record the average of the two measurements. To check your blood pressure when you are not at a hospital or clinic, you can use:  An automated blood pressure machine at a pharmacy.  A home blood pressure monitor.  If you are between 6 years and 56 years old, ask your health care provider if you should take aspirin to prevent strokes.  Have regular diabetes screenings. This involves taking a blood sample to check your fasting blood sugar level.  If you are at a normal weight and have a low risk for diabetes, have this test once every three years after 54 years of age.  If you are overweight and have a high risk for diabetes, consider being tested at a younger age or more often. Preventing infection Hepatitis B  If you have a higher risk for hepatitis B, you should be screened for this virus. You are considered at high risk for hepatitis B if:  You were born in a country where hepatitis B is common. Ask your health care provider which countries are considered high risk.  Your parents were born in a high-risk country, and you have not been immunized against hepatitis B (hepatitis B vaccine).  You have HIV or AIDS.  You use needles to inject street drugs.  You live with someone who has hepatitis B.  You have had sex with someone who has hepatitis B.  You get hemodialysis treatment.  You take certain medicines for conditions, including cancer, organ transplantation, and autoimmune conditions. Hepatitis C  Blood testing is recommended for:  Everyone born from [birth year] through 1963.  Anyone with known risk factors for hepatitis C. Sexually transmitted infections (STIs)  You should be screened for sexually transmitted infections (STIs) including gonorrhea and chlamydia if:  You are sexually active and are younger than 54 years of age.  You are older than  54 years of age and your health care provider tells you that you are at risk for this type of infection.  Your sexual activity has changed since you were last screened and you are at an increased risk for chlamydia or gonorrhea. Ask your health care provider if you are at risk.  If you do not have HIV, but are at risk, it may be recommended that you take a prescription medicine daily to prevent HIV infection. This is called pre-exposure prophylaxis (PrEP). You are considered at risk if:  You are sexually active and do  not regularly use condoms or know the HIV status of your partner(s).  You take drugs by injection.  You are sexually active with a partner who has HIV. Talk with your health care provider about whether you are at high risk of being infected with HIV. If you choose to begin PrEP, you should first be tested for HIV. You should then be tested every 3 months for as long as you are taking PrEP. Pregnancy  If you are premenopausal and you may become pregnant, ask your health care provider about preconception counseling.  If you may become pregnant, take 400 to 800 micrograms (mcg) of folic acid every day.  If you want to prevent pregnancy, talk to your health care provider about birth control (contraception). Osteoporosis and menopause  Osteoporosis is a disease in which the bones lose minerals and strength with aging. This can result in serious bone fractures. Your risk for osteoporosis can be identified using a bone density scan.  If you are 10 years of age or older, or if you are at risk for osteoporosis and fractures, ask your health care provider if you should be screened.  Ask your health care provider whether you should take a calcium or vitamin D supplement to lower your risk for osteoporosis.  Menopause may have certain physical symptoms and risks.  Hormone replacement therapy may reduce some of these symptoms and risks. Talk to your health care provider about whether  hormone replacement therapy is right for you. Follow these instructions at home:  Schedule regular health, dental, and eye exams.  Stay current with your immunizations.  Do not use any tobacco products including cigarettes, chewing tobacco, or electronic cigarettes.  If you are pregnant, do not drink alcohol.  If you are breastfeeding, limit how much and how often you drink alcohol.  Limit alcohol intake to no more than 1 drink per day for nonpregnant women. One drink equals 12 ounces of beer, 5 ounces of wine, or 1 ounces of hard liquor.  Do not use street drugs.  Do not share needles.  Ask your health care provider for help if you need support or information about quitting drugs.  Tell your health care provider if you often feel depressed.  Tell your health care provider if you have ever been abused or do not feel safe at home. This information is not intended to replace advice given to you by your health care provider. Make sure you discuss any questions you have with your health care provider. Document Released: 11/06/2010 Document Revised: 09/29/2015 Document Reviewed: 01/25/2015 Elsevier Interactive Patient Education  2017 [employer].

## 2016-07-16 NOTE — Progress Notes (Signed)
Subjective:    Patient ID: Deborah Cabrera, female    DOB: 02/11/1961, 56 y.o.   MRN: [id number]  HPI She is here for a physical exam.   She walks all day at work and her legs are sore at the end of the day.  She has pain across her lower back and down her legs.  She is active all day,but does not do exercise outside of work.    She is compliant with a low salt diet.  She does not monitor her BP.   She has hand sometimes her legs cramping during the day and night.  She drinks plenty of water during the day.   Medications and allergies reviewed with patient and updated if appropriate.  Patient Active Problem List   Diagnosis Date Noted  . Mitral regurgitation, trivial 01/17/2016  . Diastolic dysfunction without heart failure 01/17/2016  . Overweight 06/29/2014  . Transient synovitis of right knee 02/18/2014  . Primary localized osteoarthrosis, lower leg 01/01/2014  . Hypertension   . Tobacco use disorder   . Arthritis of knee 01/31/2013  . Uterine fibroids 01/19/2013  . Pyelonephritis 01/16/2013    Current Outpatient Prescriptions on File Prior to Visit  Medication Sig Dispense Refill  . losartan (COZAAR) 50 MG tablet Take 1 tablet (50 mg total) by mouth daily. Yearly physical w/labs due in February must see MD for refills 90 tablet 0  . Multiple Vitamin (MULTIVITAMIN WITH MINERALS) TABS tablet Take 1 tablet by mouth daily.     No current facility-administered medications on file prior to visit.     Past Medical History:  Diagnosis Date  . Anemia    Hgb 4 01/2013 due to menometorhagia  . Menometrorrhagia   . Pyelonephritis 01/16/2013   R PN -Ecoli - hosp for same  . Thrombocytopenia ([place]) 01/16/13   in setting of pyelonephritis hosp, 1 plt pk transfusion when plt 27K  . Uterine fibroids    dx 01/2013 by [place] in setting of severe iron def anemia    Past Surgical History:  Procedure Laterality Date  . TUBAL LIGATION Bilateral     Social History   Social History  .  Marital status: Single    Spouse name: N/A  . Number of children: N/A  . Years of education: N/A   Occupational History  . 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 Smoking status: Current Some Day Smoker    Types: Cigarettes  . Smokeless tobacco: Never Used  . Alcohol use 0.0 oz/week     Comment: Socially  . Drug use: No  . Sexual activity: Yes    Birth control/ protection: None   Other Topics Concern  . None   Social History Narrative   Lives with 19 yo son - grown 56yo dtr in [facility]   Single   employed as Materials engineer    Family History  Problem Relation Age of Onset  . Hypertension Mother   . Osteoarthritis Mother   . Hyperlipidemia Mother   . Kidney failure Father     ESRD since age 41  . Alcohol abuse Father   . Ovarian cancer Sister 33    died of same age 26  . Dementia Paternal Grandmother   . Stroke Maternal Grandmother 43  . Diabetes Mellitus II Son   . Diabetes Sister   . Colon cancer Neg Hx     Review of Systems  Constitutional: Negative  for appetite change, chills, fatigue and fever.  Eyes: Negative for visual disturbance.  Respiratory: Negative for cough, wheezing and stridor.   Cardiovascular: Negative for chest pain, palpitations and leg swelling.  Gastrointestinal: Negative for abdominal pain, blood in stool, constipation, diarrhea and nausea.       No gerd  Genitourinary: Negative for dysuria and hematuria.  Musculoskeletal: Positive for arthralgias and back pain.  Skin: Negative for color change and rash.  Neurological: Positive for dizziness. Negative for light-headedness and headaches.  Psychiatric/Behavioral: Negative for dysphoric mood. The patient is not nervous/anxious.        Objective:   Vitals:   07/17/16 1007  BP: 138/88  Pulse: 88  Resp: 16  Temp: 98.3 F (36.8 C)   Filed Weights   07/17/16 1007  Weight: 186 lb (84.4 kg)   Body mass index is 29.13 kg/m.  Wt Readings from Last 3  Encounters:  07/17/16 186 lb (84.4 kg)  12/28/15 196 lb (88.9 kg)  06/29/14 187 lb (84.8 kg)     Physical Exam Constitutional: She appears well-developed and well-nourished. No distress.  HENT:  Head: Normocephalic and atraumatic.  Right Ear: External ear normal. Normal ear canal and TM Left Ear: External ear normal.  Normal ear canal and TM Mouth/Throat: Oropharynx is clear and moist.  Eyes: Conjunctivae and EOM are normal.  Neck: Neck supple. No tracheal deviation present. No thyromegaly present.  No carotid bruit  Cardiovascular: Normal rate, regular rhythm and normal heart sounds.   No murmur heard.  No edema. Pulmonary/Chest: Effort normal and breath sounds normal. No respiratory distress. She has no wheezes. She has no rales.  Breast: deferred to Gyn Abdominal: Soft. She exhibits no distension. There is no tenderness.  Lymphadenopathy: She has no cervical adenopathy.  Skin: Skin is warm and dry. She is not diaphoretic.  Psychiatric: She has a normal mood and affect. Her behavior is normal.         Assessment & Plan:   Physical exam: Screening blood work ordered Immunizations  Up to date  Colonoscopy  Up to date  Mammogram - due, will schedule Gyn  - due, will schedule Eye exams  Up to date  EKG    today Exercise - none, but very active  Weight - has lost  Skin   No concerns Substance abuse - smokes cigarettes  -- stressed smoking cessation  See Problem List for Assessment and Plan of chronic medical problems.

## 2016-07-17 ENCOUNTER — Other Ambulatory Visit (INDEPENDENT_AMBULATORY_CARE_PROVIDER_SITE_OTHER): Payer: BLUE CROSS/BLUE SHIELD

## 2016-07-17 ENCOUNTER — Ambulatory Visit (INDEPENDENT_AMBULATORY_CARE_PROVIDER_SITE_OTHER): Payer: BLUE CROSS/BLUE SHIELD | Admitting: Internal Medicine

## 2016-07-17 ENCOUNTER — Encounter: Payer: Self-pay | Admitting: Internal Medicine

## 2016-07-17 VITALS — BP 138/88 | HR 88 | Temp 98.3°F | Resp 16 | Ht 67.0 in | Wt 186.0 lb

## 2016-07-17 DIAGNOSIS — Z0000 Encounter for general adult medical examination without abnormal findings: Secondary | ICD-10-CM | POA: Diagnosis not present

## 2016-07-17 DIAGNOSIS — F17200 Nicotine dependence, unspecified, uncomplicated: Secondary | ICD-10-CM | POA: Diagnosis not present

## 2016-07-17 DIAGNOSIS — R252 Cramp and spasm: Secondary | ICD-10-CM

## 2016-07-17 DIAGNOSIS — Z23 Encounter for immunization: Secondary | ICD-10-CM

## 2016-07-17 DIAGNOSIS — I10 Essential (primary) hypertension: Secondary | ICD-10-CM

## 2016-07-17 DIAGNOSIS — Z833 Family history of diabetes mellitus: Secondary | ICD-10-CM | POA: Diagnosis not present

## 2016-07-17 LAB — HEMOGLOBIN A1C: Hgb A1c MFr Bld: 5.3 % (ref 4.6–6.5)

## 2016-07-17 LAB — COMPREHENSIVE METABOLIC PANEL
ALT: 57 U/L — ABNORMAL HIGH (ref 0–35)
AST: 61 U/L — AB (ref 0–37)
Albumin: 4.2 g/dL (ref 3.5–5.2)
Alkaline Phosphatase: 105 U/L (ref 39–117)
BUN: 11 mg/dL (ref 6–23)
CALCIUM: 9.6 mg/dL (ref 8.4–10.5)
CHLORIDE: 101 meq/L (ref 96–112)
CO2: 24 meq/L (ref 19–32)
CREATININE: 0.67 mg/dL (ref 0.40–1.20)
GFR: 117.91 mL/min (ref >60.00)
GLUCOSE: 96 mg/dL (ref 70–99)
Potassium: 3.4 mEq/L — ABNORMAL LOW (ref 3.5–5.1)
Sodium: 139 mEq/L (ref 135–145)
Total Bilirubin: 1.2 mg/dL (ref 0.2–1.2)
Total Protein: 7.5 g/dL (ref 6.0–8.3)

## 2016-07-17 LAB — CBC WITH DIFFERENTIAL/PLATELET
BASOS PCT: 0.8 % (ref 0.0–3.0)
Basophils Absolute: 0 10*3/uL (ref 0.0–0.1)
Eosinophils Absolute: 0 10*3/uL (ref 0.0–0.7)
Eosinophils Relative: 0.8 % (ref 0.0–5.0)
HEMATOCRIT: 42.3 % (ref 36.0–46.0)
Hemoglobin: 14.3 g/dL (ref 12.0–15.0)
LYMPHS PCT: 43.4 % (ref 12.0–46.0)
Lymphs Abs: 1.4 10*3/uL (ref 0.7–4.0)
MCHC: 33.7 g/dL (ref 30.0–36.0)
MCV: 92.9 fl (ref 78.0–100.0)
MONOS PCT: 8.5 % (ref 3.0–12.0)
Monocytes Absolute: 0.3 10*3/uL (ref 0.1–1.0)
NEUTROS ABS: 1.5 10*3/uL (ref 1.4–7.7)
Neutrophils Relative %: 46.5 % (ref 43.0–77.0)
PLATELETS: 174 10*3/uL (ref 150.0–400.0)
RBC: 4.56 Mil/uL (ref 3.87–5.11)
RDW: 16.3 % — AB (ref 11.5–15.5)
WBC: 3.2 10*3/uL — ABNORMAL LOW (ref 4.0–10.5)

## 2016-07-17 LAB — LIPID PANEL
CHOL/HDL RATIO: 2
Cholesterol: 188 mg/dL (ref 0–200)
HDL: 95.8 mg/dL (ref >39.00)
LDL CALC: 58 mg/dL (ref 0–99)
NONHDL: 92.17
Triglycerides: 173 mg/dL — ABNORMAL HIGH (ref 0.0–149.0)
VLDL: 34.6 mg/dL (ref 0.0–40.0)

## 2016-07-17 LAB — TSH: TSH: 2.49 u[IU]/mL (ref 0.35–4.50)

## 2016-07-17 LAB — MAGNESIUM: MAGNESIUM: 1.7 mg/dL (ref 1.5–2.5)

## 2016-07-17 MED ORDER — OMEGA-3 1000 MG PO CAPS: ORAL_CAPSULE | ORAL | 0 refills | Status: AC

## 2016-07-17 NOTE — Progress Notes (Signed)
Pre visit review using our clinic review tool, if applicable. No additional management support is needed unless otherwise documented below in the visit note. 

## 2016-07-17 NOTE — Assessment & Plan Note (Signed)
Check a1c Very active encouraged additional weight loss

## 2016-07-17 NOTE — Assessment & Plan Note (Signed)
In hands and legs - likely from continuous activity at work (walks all day and decorates cakes) Drinks plenty of fluids Cmp, magnesium

## 2016-07-17 NOTE — Assessment & Plan Note (Addendum)
BP borderline high here today Advised her to monitor at home Continue current medication Encouraged additional weight loss

## 2016-07-17 NOTE — Assessment & Plan Note (Signed)
Did quit, but restarted after father's death Encouraged cessation

## 2016-09-14 ENCOUNTER — Other Ambulatory Visit: Payer: Self-pay | Admitting: Internal Medicine

## 2017-09-16 ENCOUNTER — Other Ambulatory Visit: Payer: Self-pay | Admitting: Internal Medicine

## 2017-09-19 ENCOUNTER — Ambulatory Visit: Payer: BLUE CROSS/BLUE SHIELD | Admitting: Internal Medicine

## 2017-09-19 DIAGNOSIS — Z0289 Encounter for other administrative examinations: Secondary | ICD-10-CM

## 2017-09-30 NOTE — Progress Notes (Deleted)
Subjective:    Patient ID: Deborah Cabrera, female    DOB: [birth year], 57 y.o.   MRN: [id number]  HPI The patient is here for follow up.  Hypertension: She is taking her medication daily. She is compliant with a low sodium diet.  She denies chest pain, palpitations, edema, shortness of breath and regular headaches. She is exercising regularly.  She does not monitor her blood pressure at home.     Medications and allergies reviewed with patient and updated if appropriate.  Patient Active Problem List   Diagnosis Date Noted  . Muscle cramping 07/17/2016  . Family history of diabetes mellitus 07/17/2016  . Mitral regurgitation, trivial 01/17/2016  . Diastolic dysfunction without heart failure 01/17/2016  . Overweight 06/29/2014  . Transient synovitis of right knee 02/18/2014  . Primary localized osteoarthrosis, lower leg 01/01/2014  . Hypertension   . Tobacco use disorder   . Arthritis of knee 01/31/2013  . Uterine fibroids 01/19/2013    Current Outpatient Medications on File Prior to Visit  Medication Sig Dispense Refill  . losartan (COZAAR) 50 MG tablet TAKE 1 TABLET BY MOUTH  DAILY 90 tablet 3  . Multiple Vitamin (MULTIVITAMIN WITH MINERALS) TABS tablet Take 1 tablet by mouth daily.    . Omega-3 1000 MG CAPS Taking daily  0   No current facility-administered medications on file prior to visit.     Past Medical History:  Diagnosis Date  . Anemia    Hgb 4 01/2013 due to menometorhagia  . Menometrorrhagia   . Pyelonephritis 01/16/2013   R PN -Ecoli - hosp for same  . Thrombocytopenia ([place]) 01/16/13   in setting of pyelonephritis hosp, 1 plt pk transfusion when plt 27K  . Uterine fibroids    dx 01/2013 by [place] in setting of severe iron def anemia    Past Surgical History:  Procedure Laterality Date  . TUBAL LIGATION Bilateral     Social History   Socioeconomic History  . Marital status: Single    Spouse name: Not on file  . Number of children: Not on file  . Years  of education: Not on file  . Highest education level: Not on file  Occupational History  . 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 Financial resource strain: Not on file  . Food insecurity:    Worry: Not on file    Inability: Not on file  . Transportation needs:    Medical: Not on file    Non-medical: Not on file  Tobacco Use  . Smoking status: Current Some Day Smoker    Types: Cigarettes  . Smokeless tobacco: Never Used  Substance and Sexual Activity  . Alcohol use: Yes    Alcohol/week: 0.0 oz    Comment: Socially  . Drug use: No    Frequency: 2.0 times per week  . Sexual activity: Yes    Birth control/protection: None  Lifestyle  . Physical activity:    Days per week: Not on file    Minutes per session: Not on file  . Stress: Not on file  Relationships  . Social connections:    Talks on phone: Not on file    Gets together: Not on file    Attends religious service: Not on file    Active member of club or organization: Not on file    Attends meetings of clubs or organizations: Not on file  Relationship status: Not on file  Other Topics Concern  . Not on file  Social History Narrative   Lives with 38 yo son - grown 27yo dtr in [facility]   Single   employed as Materials engineer    Family History  Problem Relation Age of Onset  . Hypertension Mother   . Osteoarthritis Mother   . Hyperlipidemia Mother   . Kidney failure Father        ESRD since age 74  . Alcohol abuse Father   . Dementia Father   . Ovarian cancer Sister 22       died of same age 27  . Dementia Paternal Grandmother   . Stroke Maternal Grandmother 31  . Diabetes Mellitus II Son   . Diabetes Sister   . Colon cancer Neg Hx     Review of Systems     Objective:  There were no vitals filed for this visit. BP Readings from Last 3 Encounters:  07/17/16 138/88  12/28/15 (!) 154/112  06/29/14 140/82   Wt Readings from Last 3 Encounters:  07/17/16 186  lb (84.4 kg)  12/28/15 196 lb (88.9 kg)  06/29/14 187 lb (84.8 kg)   There is no height or weight on file to calculate BMI.   Physical Exam    Constitutional: Appears well-developed and well-nourished. No distress.  HENT:  Head: Normocephalic and atraumatic.  Neck: Neck supple. No tracheal deviation present. No thyromegaly present.  No cervical lymphadenopathy Cardiovascular: Normal rate, regular rhythm and normal heart sounds.   No murmur heard. No carotid bruit .  No edema Pulmonary/Chest: Effort normal and breath sounds normal. No respiratory distress. No has no wheezes. No rales.  Skin: Skin is warm and dry. Not diaphoretic.  Psychiatric: Normal mood and affect. Behavior is normal.      Assessment & Plan:    See Problem List for Assessment and Plan of chronic medical problems.

## 2017-10-01 ENCOUNTER — Ambulatory Visit: Payer: BLUE CROSS/BLUE SHIELD | Admitting: Internal Medicine
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
## 2017-10-06 NOTE — Patient Instructions (Addendum)
  Test(s) ordered today. Your results will be released to [facility] (or called to you) after review, usually within 72hours after test completion. If any changes need to be made, you will be notified at that same time.   Medications reviewed and updated.  No changes recommended at this time.     Please followup in 6 months for a physical exam

## 2017-10-07 ENCOUNTER — Encounter: Payer: Self-pay | Admitting: Internal Medicine

## 2017-10-07 ENCOUNTER — Other Ambulatory Visit (INDEPENDENT_AMBULATORY_CARE_PROVIDER_SITE_OTHER): Payer: BLUE CROSS/BLUE SHIELD

## 2017-10-07 ENCOUNTER — Ambulatory Visit (INDEPENDENT_AMBULATORY_CARE_PROVIDER_SITE_OTHER): Payer: BLUE CROSS/BLUE SHIELD | Admitting: Internal Medicine

## 2017-10-07 VITALS — BP 158/100 | HR 75 | Temp 99.0°F | Resp 16 | Wt 180.0 lb

## 2017-10-07 DIAGNOSIS — I10 Essential (primary) hypertension: Secondary | ICD-10-CM

## 2017-10-07 DIAGNOSIS — M25532 Pain in left wrist: Secondary | ICD-10-CM

## 2017-10-07 DIAGNOSIS — Z833 Family history of diabetes mellitus: Secondary | ICD-10-CM | POA: Diagnosis not present

## 2017-10-07 DIAGNOSIS — M25531 Pain in right wrist: Secondary | ICD-10-CM | POA: Diagnosis not present

## 2017-10-07 LAB — COMPREHENSIVE METABOLIC PANEL
ALBUMIN: 4.6 g/dL (ref 3.5–5.2)
ALK PHOS: 86 U/L (ref 39–117)
ALT: 27 U/L (ref 0–35)
AST: 26 U/L (ref 0–37)
BILIRUBIN TOTAL: 0.5 mg/dL (ref 0.2–1.2)
BUN: 16 mg/dL (ref 6–23)
CO2: 28 mEq/L (ref 19–32)
CREATININE: 0.5 mg/dL (ref 0.40–1.20)
Calcium: 10.2 mg/dL (ref 8.4–10.5)
Chloride: 104 mEq/L (ref 96–112)
GFR: 164.53 mL/min (ref >60.00)
Glucose, Bld: 84 mg/dL (ref 70–99)
Potassium: 3.5 mEq/L (ref 3.5–5.1)
Sodium: 141 mEq/L (ref 135–145)
TOTAL PROTEIN: 7.5 g/dL (ref 6.0–8.3)

## 2017-10-07 LAB — HEMOGLOBIN A1C: Hgb A1c MFr Bld: 5.1 % (ref 4.6–6.5)

## 2017-10-07 MED ORDER — LOSARTAN POTASSIUM 50 MG PO TABS: 50.0000 mg | ORAL_TABLET | Freq: Every day | ORAL | 0 refills | Status: DC

## 2017-10-07 MED ORDER — LOSARTAN POTASSIUM 50 MG PO TABS: 50.0000 mg | ORAL_TABLET | Freq: Every day | ORAL | 3 refills | Status: DC

## 2017-10-07 NOTE — Assessment & Plan Note (Signed)
Pain and swelling in bilateral wrists with repetitive movement, numbness, tingling and pain in hands Likely carpal tunnel, ulnar tunnel syndrome Advised to start wearing a brace at night and when possible with certain activities during the day Will refer to hand orthopedic

## 2017-10-07 NOTE — Assessment & Plan Note (Signed)
Has a family history of diabetes and is at risk of developing diabetes Stressed Programme researcher, broadcasting/film/video exercise Ideally work on [employer] loss Stressed diabetic diet Will check A1c

## 2017-10-07 NOTE — Assessment & Plan Note (Signed)
She has been out of her medication for approximately 10 days Blood pressure elevated here today-unsure if it is controlled or not Advised her to try monitoring it She does have evidence of diastolic dysfunction on prior echo-stressed the importance of getting blood pressure well controlled Low-sodium diet, continue regular activity Return if blood pressure not controlled Follow-up in 6 months

## 2018-04-06 NOTE — Progress Notes (Signed)
Subjective:    Patient ID: Deborah Cabrera, female    DOB: Jan 27, 1961, 57 y.o.   MRN: [id number]  HPI She is here for a physical exam.     Medications and allergies reviewed with patient and updated if appropriate.  Patient Active Problem List   Diagnosis Date Noted  . Pain in both wrists 10/07/2017  . Muscle cramping 07/17/2016  . Family history of diabetes mellitus 07/17/2016  . Mitral regurgitation, trivial 01/17/2016  . Diastolic dysfunction without heart failure 01/17/2016  . Overweight 06/29/2014  . Transient synovitis of right knee 02/18/2014  . Primary localized osteoarthrosis, lower leg 01/01/2014  . Hypertension   . Tobacco use disorder   . Arthritis of knee 01/31/2013  . Uterine fibroids 01/19/2013    Current Outpatient Medications on File Prior to Visit  Medication Sig Dispense Refill  . losartan (COZAAR) 50 MG tablet Take 1 tablet (50 mg total) by mouth daily. 14 tablet 0  . Multiple Vitamin (MULTIVITAMIN WITH MINERALS) TABS tablet Take 1 tablet by mouth daily.    . Omega-3 1000 MG CAPS Taking daily  0   No current facility-administered medications on file prior to visit.     Past Medical History:  Diagnosis Date  . Anemia    Hgb 4 01/2013 due to menometorhagia  . Menometrorrhagia   . Pyelonephritis 01/16/2013   R PN -Ecoli - hosp for same  . Thrombocytopenia ([place]) 01/16/13   in setting of pyelonephritis hosp, 1 plt pk transfusion when plt 27K  . Uterine fibroids    dx 01/2013 by [place] in setting of severe iron def anemia    Past Surgical History:  Procedure Laterality Date  . TUBAL LIGATION Bilateral     Social History   Socioeconomic History  . Marital status: Single    Spouse name: Not on file  . Number of children: Not on file  . Years of education: Not on file  . Highest education level: Not on file  Occupational History  . 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 Financial resource  strain: Not on file  . Food insecurity:    Worry: Not on file    Inability: Not on file  . Transportation needs:    Medical: Not on file    Non-medical: Not on file  Tobacco Use  . Smoking status: Current Some Day Smoker    Types: Cigarettes  . Smokeless tobacco: Never Used  Substance and Sexual Activity  . Alcohol use: Yes    Alcohol/week: 0.0 standard drinks    Comment: Socially  . Drug use: No    Frequency: 2.0 times per week  . Sexual activity: Yes    Birth control/protection: None  Lifestyle  . Physical activity:    Days per week: Not on file    Minutes per session: Not on file  . Stress: Not on file  Relationships  . Social connections:    Talks on phone: Not on file    Gets together: Not on file    Attends religious service: Not on file    Active member of club or organization: Not on file    Attends meetings of clubs or organizations: Not on file    Relationship status: Not on file  Other Topics Concern  . Not on file  Social History Narrative   Lives with 46 yo son - grown 33yo dtr in [facility]  Single   employed as Materials engineer    Family History  Problem Relation Age of Onset  . Hypertension Mother   . Osteoarthritis Mother   . Hyperlipidemia Mother   . Kidney failure Father        ESRD since age 23  . Alcohol abuse Father   . Dementia Father   . Ovarian cancer Sister 49       died of same age 70  . Dementia Paternal Grandmother   . Stroke Maternal Grandmother 2  . Diabetes Mellitus II Son   . Diabetes Sister   . Colon cancer Neg Hx     Review of Systems     Objective:  There were no vitals filed for this visit. There were no vitals filed for this visit. There is no height or weight on file to calculate BMI.  BP Readings from Last 3 Encounters:  10/07/17 (!) 158/100  07/17/16 138/88  12/28/15 (!) 154/112    Wt Readings from Last 3 Encounters:  10/07/17 180 lb (81.6 kg)  07/17/16 186 lb (84.4 kg)  12/28/15 196 lb (88.9 kg)     Physical  Exam       Assessment & Plan:        This encounter was created in error - please disregard.

## 2018-04-06 NOTE — Patient Instructions (Signed)
Tests ordered today. Your results will be released to [facility] (or called to you) after review, usually within 72hours after test completion. If any changes need to be made, you will be notified at that same time.  All other Health Maintenance issues reviewed.   All recommended immunizations and age-appropriate screenings are up-to-date or discussed.  No immunizations administered today.   Medications reviewed and updated.  Changes include :     Your prescription(s) have been submitted to your pharmacy. Please take as directed and contact our office if you believe you are having problem(s) with the medication(s).  A referral was ordered for   Please followup in    Health Maintenance, Female Adopting a healthy lifestyle and getting preventive care can go a long way to promote health and wellness. Talk with your health care provider about what schedule of regular examinations is right for you. This is a good chance for you to check in with your provider about disease prevention and staying healthy. In between checkups, there are plenty of things you can do on your own. Experts have done a lot of research about which lifestyle changes and preventive measures are most likely to keep you healthy. Ask your health care provider for more information. Weight and diet Eat a healthy diet  Be sure to include plenty of vegetables, fruits, low-fat dairy products, and lean protein.  Do not eat a lot of foods high in solid fats, added sugars, or salt.  Get regular exercise. This is one of the most important things you can do for your health. ? Most adults should exercise for at least 150 minutes each week. The exercise should increase your heart rate and make you sweat (moderate-intensity exercise). ? Most adults should also do strengthening exercises at least twice a week. This is in addition to the moderate-intensity exercise.  Maintain a healthy weight  Body mass index (BMI) is a measurement that  can be used to identify possible weight problems. It estimates body fat based on height and weight. Your health care provider can help determine your BMI and help you achieve or maintain a healthy weight.  For females 50 years of age and older: ? A BMI below 18.5 is considered underweight. ? A BMI of 18.5 to 24.9 is normal. ? A BMI of 25 to 29.9 is considered overweight. ? A BMI of 30 and above is considered obese.  Watch levels of cholesterol and blood lipids  You should start having your blood tested for lipids and cholesterol at 55 years of age, then have this test every 5 years.  You may need to have your cholesterol levels checked more often if: ? Your lipid or cholesterol levels are high. ? You are older than 55 years of age. ? You are at high risk for heart disease.  Cancer screening Lung Cancer  Lung cancer screening is recommended for adults 4-43 years old who are at high risk for lung cancer because of a history of smoking.  A yearly low-dose CT scan of the lungs is recommended for people who: ? Currently smoke. ? Have quit within the past 15 years. ? Have at least a 30-pack-year history of smoking. A pack year is smoking an average of one pack of cigarettes a day for 1 year.  Yearly screening should continue until it has been 15 years since you quit.  Yearly screening should stop if you develop a health problem that would prevent you from having lung cancer treatment.  Breast  Cancer  Practice breast self-awareness. This means understanding how your breasts normally appear and feel.  It also means doing regular breast self-exams. Let your health care provider know about any changes, no matter how small.  If you are in your 20s or 30s, you should have a clinical breast exam (CBE) by a health care provider every 1-3 years as part of a regular health exam.  If you are 40 or older, have a CBE every year. Also consider having a breast X-ray (mammogram) every year.  If  you have a family history of breast cancer, talk to your health care provider about genetic screening.  If you are at high risk for breast cancer, talk to your health care provider about having an MRI and a mammogram every year.  Breast cancer gene (BRCA) assessment is recommended for women who have family members with BRCA-related cancers. BRCA-related cancers include: ? Breast. ? Ovarian. ? Tubal. ? Peritoneal cancers.  Results of the assessment will determine the need for genetic counseling and BRCA1 and BRCA2 testing.  Cervical Cancer Your health care provider may recommend that you be screened regularly for cancer of the pelvic organs (ovaries, uterus, and vagina). This screening involves a pelvic examination, including checking for microscopic changes to the surface of your cervix (Pap test). You may be encouraged to have this screening done every 3 years, beginning at age 21.  For women ages 30-65, health care providers may recommend pelvic exams and Pap testing every 3 years, or they may recommend the Pap and pelvic exam, combined with testing for human papilloma virus (HPV), every 5 years. Some types of HPV increase your risk of cervical cancer. Testing for HPV may also be done on women of any age with unclear Pap test results.  Other health care providers may not recommend any screening for nonpregnant women who are considered low risk for pelvic cancer and who do not have symptoms. Ask your health care provider if a screening pelvic exam is right for you.  If you have had past treatment for cervical cancer or a condition that could lead to cancer, you need Pap tests and screening for cancer for at least 20 years after your treatment. If Pap tests have been discontinued, your risk factors (such as having a new sexual partner) need to be reassessed to determine if screening should resume. Some women have medical problems that increase the chance of getting cervical cancer. In these cases,  your health care provider may recommend more frequent screening and Pap tests.  Colorectal Cancer  This type of cancer can be detected and often prevented.  Routine colorectal cancer screening usually begins at 55 years of age and continues through 55 years of age.  Your health care provider may recommend screening at an earlier age if you have risk factors for colon cancer.  Your health care provider may also recommend using home test kits to check for hidden blood in the stool.  A small camera at the end of a tube can be used to examine your colon directly (sigmoidoscopy or colonoscopy). This is done to check for the earliest forms of colorectal cancer.  Routine screening usually begins at age 50.  Direct examination of the colon should be repeated every 5-10 years through 55 years of age. However, you may need to be screened more often if early forms of precancerous polyps or small growths are found.  Skin Cancer  Check your skin from head to toe regularly.  Tell your   health care provider about any new moles or changes in moles, especially if there is a change in a mole's shape or color.  Also tell your health care provider if you have a mole that is larger than the size of a pencil eraser.  Always use sunscreen. Apply sunscreen liberally and repeatedly throughout the day.  Protect yourself by wearing long sleeves, pants, a wide-brimmed hat, and sunglasses whenever you are outside.  Heart disease, diabetes, and high blood pressure  High blood pressure causes heart disease and increases the risk of stroke. High blood pressure is more likely to develop in: ? People who have blood pressure in the high end of the normal range (130-139/85-89 mm Hg). ? People who are overweight or obese. ? People who are African American.  If you are 18-43 years of age, have your blood pressure checked every 3-5 years. If you are 31 years of age or older, have your blood pressure checked every year.  You should have your blood pressure measured twice-once when you are at a hospital or clinic, and once when you are not at a hospital or clinic. Record the average of the two measurements. To check your blood pressure when you are not at a hospital or clinic, you can use: ? An automated blood pressure machine at a pharmacy. ? A home blood pressure monitor.  If you are between 72 years and 17 years old, ask your health care provider if you should take aspirin to prevent strokes.  Have regular diabetes screenings. This involves taking a blood sample to check your fasting blood sugar level. ? If you are at a normal weight and have a low risk for diabetes, have this test once every three years after 55 years of age. ? If you are overweight and have a high risk for diabetes, consider being tested at a younger age or more often. Preventing infection Hepatitis B  If you have a higher risk for hepatitis B, you should be screened for this virus. You are considered at high risk for hepatitis B if: ? You were born in a country where hepatitis B is common. Ask your health care provider which countries are considered high risk. ? Your parents were born in a high-risk country, and you have not been immunized against hepatitis B (hepatitis B vaccine). ? You have HIV or AIDS. ? You use needles to inject street drugs. ? You live with someone who has hepatitis B. ? You have had sex with someone who has hepatitis B. ? You get hemodialysis treatment. ? You take certain medicines for conditions, including cancer, organ transplantation, and autoimmune conditions.  Hepatitis C  Blood testing is recommended for: ? Everyone born from [birth year] through 1963. ? Anyone with known risk factors for hepatitis C.  Sexually transmitted infections (STIs)  You should be screened for sexually transmitted infections (STIs) including gonorrhea and chlamydia if: ? You are sexually active and are younger than 55 years of  age. ? You are older than 55 years of age and your health care provider tells you that you are at risk for this type of infection. ? Your sexual activity has changed since you were last screened and you are at an increased risk for chlamydia or gonorrhea. Ask your health care provider if you are at risk.  If you do not have HIV, but are at risk, it may be recommended that you take a prescription medicine daily to prevent HIV infection. This is called pre-exposure prophylaxis (  PrEP). You are considered at risk if: ? You are sexually active and do not regularly use condoms or know the HIV status of your partner(s). ? You take drugs by injection. ? You are sexually active with a partner who has HIV.  Talk with your health care provider about whether you are at high risk of being infected with HIV. If you choose to begin PrEP, you should first be tested for HIV. You should then be tested every 3 months for as long as you are taking PrEP. Pregnancy  If you are premenopausal and you may become pregnant, ask your health care provider about preconception counseling.  If you may become pregnant, take 400 to 800 micrograms (mcg) of folic acid every day.  If you want to prevent pregnancy, talk to your health care provider about birth control (contraception). Osteoporosis and menopause  Osteoporosis is a disease in which the bones lose minerals and strength with aging. This can result in serious bone fractures. Your risk for osteoporosis can be identified using a bone density scan.  If you are 103 years of age or older, or if you are at risk for osteoporosis and fractures, ask your health care provider if you should be screened.  Ask your health care provider whether you should take a calcium or vitamin D supplement to lower your risk for osteoporosis.  Menopause may have certain physical symptoms and risks.  Hormone replacement therapy may reduce some of these symptoms and risks. Talk to your health  care provider about whether hormone replacement therapy is right for you. Follow these instructions at home:  Schedule regular health, dental, and eye exams.  Stay current with your immunizations.  Do not use any tobacco products including cigarettes, chewing tobacco, or electronic cigarettes.  If you are pregnant, do not drink alcohol.  If you are breastfeeding, limit how much and how often you drink alcohol.  Limit alcohol intake to no more than 1 drink per day for nonpregnant women. One drink equals 12 ounces of beer, 5 ounces of wine, or 1 ounces of hard liquor.  Do not use street drugs.  Do not share needles.  Ask your health care provider for help if you need support or information about quitting drugs.  Tell your health care provider if you often feel depressed.  Tell your health care provider if you have ever been abused or do not feel safe at home. This information is not intended to replace advice given to you by your health care provider. Make sure you discuss any questions you have with your health care provider. Document Released: 11/06/2010 Document Revised: 09/29/2015 Document Reviewed: 01/25/2015 Elsevier Interactive Patient Education  [employer].

## 2018-04-08 ENCOUNTER — Encounter: Payer: BLUE CROSS/BLUE SHIELD | Admitting: Internal Medicine

## 2018-04-08 DIAGNOSIS — Z0289 Encounter for other administrative examinations: Secondary | ICD-10-CM

## 2018-07-26 ENCOUNTER — Emergency Department (HOSPITAL_COMMUNITY): Payer: Self-pay

## 2018-07-26 ENCOUNTER — Other Ambulatory Visit: Payer: Self-pay

## 2018-07-26 ENCOUNTER — Encounter (HOSPITAL_COMMUNITY): Payer: Self-pay

## 2018-07-26 ENCOUNTER — Inpatient Hospital Stay (HOSPITAL_COMMUNITY)
Admission: EM | Admit: 2018-07-26 | Discharge: 2018-07-31 | DRG: 641 | Disposition: A | Discharge status: Home Health | Payer: Self-pay | Attending: Internal Medicine | Admitting: Internal Medicine

## 2018-07-26 DIAGNOSIS — F10239 Alcohol dependence with withdrawal, unspecified: Secondary | ICD-10-CM | POA: Diagnosis present

## 2018-07-26 DIAGNOSIS — M7989 Other specified soft tissue disorders: Secondary | ICD-10-CM | POA: Diagnosis present

## 2018-07-26 DIAGNOSIS — R600 Localized edema: Secondary | ICD-10-CM

## 2018-07-26 DIAGNOSIS — R609 Edema, unspecified: Secondary | ICD-10-CM

## 2018-07-26 DIAGNOSIS — F17210 Nicotine dependence, cigarettes, uncomplicated: Secondary | ICD-10-CM | POA: Diagnosis present

## 2018-07-26 DIAGNOSIS — F1010 Alcohol abuse, uncomplicated: Secondary | ICD-10-CM

## 2018-07-26 DIAGNOSIS — E876 Hypokalemia: Principal | ICD-10-CM

## 2018-07-26 DIAGNOSIS — K7010 Alcoholic hepatitis without ascites: Secondary | ICD-10-CM | POA: Diagnosis present

## 2018-07-26 DIAGNOSIS — E785 Hyperlipidemia, unspecified: Secondary | ICD-10-CM | POA: Diagnosis present

## 2018-07-26 DIAGNOSIS — R7989 Other specified abnormal findings of blood chemistry: Secondary | ICD-10-CM

## 2018-07-26 DIAGNOSIS — R945 Abnormal results of liver function studies: Secondary | ICD-10-CM

## 2018-07-26 DIAGNOSIS — R160 Hepatomegaly, not elsewhere classified: Secondary | ICD-10-CM

## 2018-07-26 DIAGNOSIS — E669 Obesity, unspecified: Secondary | ICD-10-CM | POA: Diagnosis present

## 2018-07-26 DIAGNOSIS — K760 Fatty (change of) liver, not elsewhere classified: Secondary | ICD-10-CM | POA: Diagnosis present

## 2018-07-26 DIAGNOSIS — F17200 Nicotine dependence, unspecified, uncomplicated: Secondary | ICD-10-CM | POA: Diagnosis present

## 2018-07-26 DIAGNOSIS — Z6827 Body mass index (BMI) 27.0-27.9, adult: Secondary | ICD-10-CM

## 2018-07-26 DIAGNOSIS — Z23 Encounter for immunization: Secondary | ICD-10-CM

## 2018-07-26 DIAGNOSIS — D539 Nutritional anemia, unspecified: Secondary | ICD-10-CM | POA: Diagnosis present

## 2018-07-26 DIAGNOSIS — R197 Diarrhea, unspecified: Secondary | ICD-10-CM | POA: Diagnosis present

## 2018-07-26 DIAGNOSIS — D500 Iron deficiency anemia secondary to blood loss (chronic): Secondary | ICD-10-CM | POA: Diagnosis present

## 2018-07-26 DIAGNOSIS — K709 Alcoholic liver disease, unspecified: Secondary | ICD-10-CM

## 2018-07-26 DIAGNOSIS — M47812 Spondylosis without myelopathy or radiculopathy, cervical region: Secondary | ICD-10-CM

## 2018-07-26 DIAGNOSIS — I10 Essential (primary) hypertension: Secondary | ICD-10-CM | POA: Diagnosis present

## 2018-07-26 DIAGNOSIS — R531 Weakness: Secondary | ICD-10-CM

## 2018-07-26 DIAGNOSIS — R52 Pain, unspecified: Secondary | ICD-10-CM

## 2018-07-26 DIAGNOSIS — D259 Leiomyoma of uterus, unspecified: Secondary | ICD-10-CM | POA: Diagnosis present

## 2018-07-26 DIAGNOSIS — Z833 Family history of diabetes mellitus: Secondary | ICD-10-CM

## 2018-07-26 DIAGNOSIS — I959 Hypotension, unspecified: Secondary | ICD-10-CM | POA: Diagnosis present

## 2018-07-26 LAB — COMPREHENSIVE METABOLIC PANEL
ALBUMIN: 2.5 g/dL — AB (ref 3.5–5.0)
ALT: 126 U/L — ABNORMAL HIGH (ref 0–44)
AST: 362 U/L — ABNORMAL HIGH (ref 15–41)
Alkaline Phosphatase: 295 U/L — ABNORMAL HIGH (ref 38–126)
Anion gap: 21 — ABNORMAL HIGH (ref 5–15)
BUN: 10 mg/dL (ref 6–20)
CO2: 36 mmol/L — ABNORMAL HIGH (ref 22–32)
CREATININE: 0.98 mg/dL (ref 0.44–1.00)
Calcium: 5.9 mg/dL — CL (ref 8.9–10.3)
Chloride: 85 mmol/L — ABNORMAL LOW (ref 98–111)
GFR calc Af Amer: >60 mL/min (ref >60)
GFR calc non Af Amer: >60 mL/min (ref >60)
GLUCOSE: 106 mg/dL — AB (ref 70–99)
Potassium: <2 mmol/L — CL (ref 3.5–5.1)
Sodium: 142 mmol/L (ref 135–145)
TOTAL PROTEIN: 6.6 g/dL (ref 6.5–8.1)
Total Bilirubin: 3.7 mg/dL — ABNORMAL HIGH (ref 0.3–1.2)

## 2018-07-26 LAB — CBC WITH DIFFERENTIAL/PLATELET
Abs Immature Granulocytes: 0.07 10*3/uL (ref 0.00–0.07)
Basophils Absolute: 0 10*3/uL (ref 0.0–0.1)
Basophils Relative: 1 %
EOS ABS: 0 10*3/uL (ref 0.0–0.5)
Eosinophils Relative: 0 %
HCT: 32.7 % — ABNORMAL LOW (ref 36.0–46.0)
Hemoglobin: 11 g/dL — ABNORMAL LOW (ref 12.0–15.0)
Immature Granulocytes: 1 %
Lymphocytes Relative: 20 %
Lymphs Abs: 1.2 10*3/uL (ref 0.7–4.0)
MCH: 34.2 pg — ABNORMAL HIGH (ref 26.0–34.0)
MCHC: 33.6 g/dL (ref 30.0–36.0)
MCV: 101.6 fL — ABNORMAL HIGH (ref 80.0–100.0)
Monocytes Absolute: 0.5 10*3/uL (ref 0.1–1.0)
Monocytes Relative: 9 %
Neutro Abs: 4.3 10*3/uL (ref 1.7–7.7)
Neutrophils Relative %: 69 %
Platelets: 203 10*3/uL (ref 150–400)
RBC: 3.22 MIL/uL — ABNORMAL LOW (ref 3.87–5.11)
RDW: 19.2 % — ABNORMAL HIGH (ref 11.5–15.5)
WBC: 6.1 10*3/uL (ref 4.0–10.5)
nRBC: 0.7 % — ABNORMAL HIGH (ref 0.0–0.2)

## 2018-07-26 LAB — URINALYSIS, ROUTINE W REFLEX MICROSCOPIC
Bilirubin Urine: NEGATIVE
Glucose, UA: NEGATIVE mg/dL
Ketones, ur: 5 mg/dL — AB
Leukocytes,Ua: NEGATIVE
Nitrite: NEGATIVE
Protein, ur: 30 mg/dL — AB
Specific Gravity, Urine: 1.008 (ref 1.005–1.030)
pH: 6 (ref 5.0–8.0)

## 2018-07-26 LAB — SEDIMENTATION RATE: Sed Rate: 66 mm/hr — ABNORMAL HIGH (ref 0–22)

## 2018-07-26 LAB — RAPID URINE DRUG SCREEN, HOSP PERFORMED
Amphetamines: NOT DETECTED
Barbiturates: NOT DETECTED
Benzodiazepines: NOT DETECTED
Cocaine: NOT DETECTED
OPIATES: NOT DETECTED
Tetrahydrocannabinol: NOT DETECTED

## 2018-07-26 LAB — ETHANOL: Alcohol, Ethyl (B): <10 mg/dL (ref <10)

## 2018-07-26 LAB — PROTIME-INR
INR: 1.4 — ABNORMAL HIGH (ref 0.8–1.2)
Prothrombin Time: 17.1 seconds — ABNORMAL HIGH (ref 11.4–15.2)

## 2018-07-26 MED ORDER — SODIUM CHLORIDE 0.9 % IV SOLN
1.0000 g | Freq: Once | INTRAVENOUS | Status: AC
  Administered 2018-07-26: 1 g via INTRAVENOUS
  Filled 2018-07-26: qty 10

## 2018-07-26 MED ORDER — POTASSIUM CHLORIDE 10 MEQ/100ML IV SOLN
10.0000 meq | INTRAVENOUS | Status: AC
  Administered 2018-07-26 (×4): 10 meq via INTRAVENOUS
  Filled 2018-07-26 (×4): qty 100

## 2018-07-26 MED ORDER — POTASSIUM CHLORIDE CRYS ER 20 MEQ PO TBCR
40.0000 meq | EXTENDED_RELEASE_TABLET | Freq: Once | ORAL | Status: AC
  Administered 2018-07-26: 40 meq via ORAL
  Filled 2018-07-26: qty 2

## 2018-07-26 NOTE — ED Notes (Signed)
In&Out cath performed with NT, about 100 mL of dark yellow urine obtained. Urine and culture sent to lab.

## 2018-07-26 NOTE — ED Notes (Signed)
ED TO INPATIENT HANDOFF REPORT  Name/Age/Gender Deborah Cabrera 58 y.o. female Room/Bed: WA15/WA15  Code Status   Code Status: Prior  Home/SNF/Other Home Patient oriented to: self, place, time and situation Is this baseline? Yes   Triage Complete: Triage complete  Chief Complaint Neck pain,Incontience  Triage Note Patient presented to ed with c/o incontinence and neck pain. Patient state she recently had a cold and she is not getting better and that have cause her to developed incontinence. Patient also complain of generalized weakness.   Allergies No Known Allergies  Level of Care/Admitting Diagnosis ED Disposition    ED Disposition Condition [facility]: [facility] [[id number]]  Level of Care: Stepdown [14]  Admit to SDU based on following criteria: Hemodynamic compromise or significant risk of instability:  Patient requiring short term acute titration and management of vasoactive drips, and invasive monitoring (i.e., CVP and Arterial line).  Diagnosis: Hypokalemia [161096]  Admitting Physician: Elwyn Reach [[id number]]  Attending Physician: Elwyn Reach [[id number]]  Estimated length of stay: past midnight tomorrow  Certification:: I certify this patient will need inpatient services for at least 2 midnights  PT Class (Do Not Modify): Inpatient [101]  PT Acc Code (Do Not Modify): Private [1]       B Medical/Surgery History Past Medical History:  Diagnosis Date  . Anemia    Hgb 4 01/2013 due to menometorhagia  . Menometrorrhagia   . Pyelonephritis 01/16/2013   R PN -Ecoli - hosp for same  . Thrombocytopenia ([place]) 01/16/13   in setting of pyelonephritis hosp, 1 plt pk transfusion when plt 27K  . Uterine fibroids    dx 01/2013 by [place] in setting of severe iron def anemia   Past Surgical History:  Procedure Laterality Date  . TUBAL LIGATION Bilateral      A IV Location/Drains/Wounds Patient Lines/Drains/Airways Status   Active  Line/Drains/Airways    Name:   Placement date:   Placement time:   Site:   Days:   Peripheral IV 07/26/18 Right;Upper Forearm   07/26/18    1859    Forearm   less than 1          Intake/Output Last 24 hours  Intake/Output Summary (Last 24 hours) at 07/26/2018 2030 Last data filed at 07/26/2018 2030 Gross per 24 hour  Intake 100 ml  Output -  Net 100 ml    Labs/Imaging Results for orders placed or performed during the hospital encounter of 07/26/18 (from the past 48 hour(s))  Comprehensive metabolic panel     Status: Abnormal   Collection Time: 07/26/18  4:00 PM  Result Value Ref Range   Sodium 142 135 - 145 mmol/L   Potassium <2.0 (LL) 3.5 - 5.1 mmol/L    Comment: CRITICAL RESULT CALLED TO, READ BACK BY AND VERIFIED WITH: JESSEE,B RN @1842  ON 07/26/2018 JACKSON,K    Chloride 85 (L) 98 - 111 mmol/L   CO2 36 (H) 22 - 32 mmol/L   Glucose, Bld 106 (H) 70 - 99 mg/dL   BUN 10 6 - 20 mg/dL   Creatinine, Ser 0.98 0.44 - 1.00 mg/dL   Calcium 5.9 (LL) 8.9 - 10.3 mg/dL    Comment: CRITICAL RESULT CALLED TO, READ BACK BY AND VERIFIED WITH: JESSEE,B RN @1842  ON 07/26/2018 JACKSON,K    Total Protein 6.6 6.5 - 8.1 g/dL   Albumin 2.5 (L) 3.5 - 5.0 g/dL   AST 362 (H) 15 - 41 U/L  ALT 126 (H) 0 - 44 U/L   Alkaline Phosphatase 295 (H) 38 - 126 U/L   Total Bilirubin 3.7 (H) 0.3 - 1.2 mg/dL   GFR calc non Af Amer >60 >60 mL/min   GFR calc Af Amer >60 >60 mL/min   Anion gap 21 (H) 5 - 15    Comment: Performed at [facility], [place] [address], [place], [place] [address]  CBC with Differential     Status: Abnormal   Collection Time: 07/26/18  4:00 PM  Result Value Ref Range   WBC 6.1 4.0 - 10.5 K/uL   RBC 3.22 (L) 3.87 - 5.11 MIL/uL   Hemoglobin 11.0 (L) 12.0 - 15.0 g/dL   HCT 32.7 (L) 36.0 - 46.0 %   MCV 101.6 (H) 80.0 - 100.0 fL   MCH 34.2 (H) 26.0 - 34.0 pg   MCHC 33.6 30.0 - 36.0 g/dL   RDW 19.2 (H) 11.5 - 15.5 %   Platelets 203 150 - 400 K/uL   nRBC 0.7 (H) 0.0  - 0.2 %   Neutrophils Relative % 69 %   Neutro Abs 4.3 1.7 - 7.7 K/uL   Lymphocytes Relative 20 %   Lymphs Abs 1.2 0.7 - 4.0 K/uL   Monocytes Relative 9 %   Monocytes Absolute 0.5 0.1 - 1.0 K/uL   Eosinophils Relative 0 %   Eosinophils Absolute 0.0 0.0 - 0.5 K/uL   Basophils Relative 1 %   Basophils Absolute 0.0 0.0 - 0.1 K/uL   Immature Granulocytes 1 %   Abs Immature Granulocytes 0.07 0.00 - 0.07 K/uL    Comment: Performed at [facility], [place] [address], [place], [place] [address]  Sedimentation rate     Status: Abnormal   Collection Time: 07/26/18  4:00 PM  Result Value Ref Range   Sed Rate 66 (H) 0 - 22 mm/hr    Comment: Performed at [facility], [place] [address] [place], [place] [address]  Protime-INR     Status: Abnormal   Collection Time: 07/26/18  4:00 PM  Result Value Ref Range   Prothrombin Time 17.1 (H) 11.4 - 15.2 seconds   INR 1.4 (H) 0.8 - 1.2    Comment: (NOTE) INR goal varies based on device and disease states. Performed at [facility], [place] [address], [place], [place] [address]   Ethanol     Status: None   Collection Time: 07/26/18  5:11 PM  Result Value Ref Range   Alcohol, Ethyl (B) <10 <10 mg/dL    Comment: (NOTE) Lowest detectable limit for serum alcohol is 10 mg/dL. For medical purposes only. Performed at [facility], [place] [address] [place],  [address]    Dg Chest 2 View  Result Date: 07/26/2018 CLINICAL DATA:  Chest pain EXAM: CHEST - 2 VIEW COMPARISON:  01/16/2013 FINDINGS: The heart size and mediastinal contours are within normal limits. Both lungs are clear. The visualized skeletal structures are unremarkable. IMPRESSION: No active cardiopulmonary disease. Electronically Signed   By: Inez Catalina M.D.   On: 07/26/2018 16:36   Ct Head Wo Contrast  Result Date: 07/26/2018 CLINICAL DATA:  General weakness and neck pain. Woke up with neck pain. EXAM: CT HEAD  WITHOUT CONTRAST CT CERVICAL SPINE WITHOUT CONTRAST TECHNIQUE: Multidetector CT imaging of the head and cervical spine was performed following the standard protocol without intravenous contrast. Multiplanar CT image reconstructions of the cervical spine were also generated. COMPARISON:  Head CT  dated 01/17/2013. FINDINGS: CT HEAD FINDINGS Brain: Ventricles are stable in size and configuration. There is no mass, hemorrhage, edema or other evidence of acute parenchymal abnormality. No extra-axial hemorrhage. Vascular: No hyperdense vessel or unexpected calcification. Skull: Normal. Negative for fracture or focal lesion. Sinuses/Orbits: Visualized paranasal sinuses are clear. Periorbital and retro-orbital soft tissues are unremarkable. Other: None. CT CERVICAL SPINE FINDINGS Alignment: Mild levoscoliosis which may be accentuated by patient positioning. Slight reversal of the normal cervical spine lordosis which could be related to patient positioning or muscle spasm. No evidence of acute vertebral body subluxation. Skull base and vertebrae: No fracture line or displaced fracture fragment seen. No acute or suspicious osseous lesion. Soft tissues and spinal canal: No prevertebral soft tissue swelling or edema. No hemorrhage seen within the central canal. Disc levels: Mild degenerative spondylosis within the mid to lower cervical spine, most prominent at the C5-6 level with associated disc space narrowing and osseous spurring. Associated chronic appearing wedging of the C5 vertebral body. No more than mild central canal stenosis at any level. Upper chest: Negative. Other: Bilateral carotid atherosclerosis, RIGHT greater than LEFT. IMPRESSION: 1. Negative head CT. No intracranial mass, hemorrhage or edema. No skull fracture. 2. No fracture or acute subluxation within the cervical spine. Mild degenerative change within the mid to lower cervical spine, most prominent at the C5-6 level. 3. Carotid atherosclerosis, right  greater than left. Electronically Signed   By: Franki Cabot M.D.   On: 07/26/2018 16:50   Ct Cervical Spine Wo Contrast  Result Date: 07/26/2018 CLINICAL DATA:  General weakness and neck pain. Woke up with neck pain. EXAM: CT HEAD WITHOUT CONTRAST CT CERVICAL SPINE WITHOUT CONTRAST TECHNIQUE: Multidetector CT imaging of the head and cervical spine was performed following the standard protocol without intravenous contrast. Multiplanar CT image reconstructions of the cervical spine were also generated. COMPARISON:  Head CT dated 01/17/2013. FINDINGS: CT HEAD FINDINGS Brain: Ventricles are stable in size and configuration. There is no mass, hemorrhage, edema or other evidence of acute parenchymal abnormality. No extra-axial hemorrhage. Vascular: No hyperdense vessel or unexpected calcification. Skull: Normal. Negative for fracture or focal lesion. Sinuses/Orbits: Visualized paranasal sinuses are clear. Periorbital and retro-orbital soft tissues are unremarkable. Other: None. CT CERVICAL SPINE FINDINGS Alignment: Mild levoscoliosis which may be accentuated by patient positioning. Slight reversal of the normal cervical spine lordosis which could be related to patient positioning or muscle spasm. No evidence of acute vertebral body subluxation. Skull base and vertebrae: No fracture line or displaced fracture fragment seen. No acute or suspicious osseous lesion. Soft tissues and spinal canal: No prevertebral soft tissue swelling or edema. No hemorrhage seen within the central canal. Disc levels: Mild degenerative spondylosis within the mid to lower cervical spine, most prominent at the C5-6 level with associated disc space narrowing and osseous spurring. Associated chronic appearing wedging of the C5 vertebral body. No more than mild central canal stenosis at any level. Upper chest: Negative. Other: Bilateral carotid atherosclerosis, RIGHT greater than LEFT. IMPRESSION: 1. Negative head CT. No intracranial mass,  hemorrhage or edema. No skull fracture. 2. No fracture or acute subluxation within the cervical spine. Mild degenerative change within the mid to lower cervical spine, most prominent at the C5-6 level. 3. Carotid atherosclerosis, right greater than left. Electronically Signed   By: Franki Cabot M.D.   On: 07/26/2018 16:50    Pending Labs Unresulted Labs (From admission, onward)    Start     Ordered   07/26/18 1557  Urinalysis, Routine w reflex microscopic  ONCE - STAT,   STAT     07/26/18 1559   07/26/18 1557  Urine culture  ONCE - STAT,   STAT     07/26/18 1559   07/26/18 1557  Urine rapid drug screen (hosp performed)  ONCE - STAT,   STAT     07/26/18 1559   Signed and Held  HIV antibody (Routine Testing)  Once,   R     Signed and Held   Signed and Held  CBC  (heparin)  Once,   R    Comments:  Baseline for heparin therapy IF NOT ALREADY DRAWN.  Notify MD if PLT < 100 K.    Signed and Held   Signed and Held  Creatinine, serum  (heparin)  Once,   R    Comments:  Baseline for heparin therapy IF NOT ALREADY DRAWN.    Signed and Held   Signed and Held  CBC  Tomorrow morning,   R     Signed and Held   Signed and Held  Basic metabolic panel  Now then every 4 hours,   R     Signed and Held          Vitals/Pain Today's Vitals   07/26/18 1814 07/26/18 1930 07/26/18 2000 07/26/18 2027  BP: 126/81 108/82 110/79   Pulse: 100 86 88   Resp:  17    Temp:      TempSrc:      SpO2: 98% 97% 95%   PainSc:    0-No pain    Isolation Precautions No active isolations  Medications Medications  potassium chloride 10 mEq in 100 mL IVPB (10 mEq Intravenous New Bag/Given 07/26/18 2027)  potassium chloride SA (K-DUR,KLOR-CON) CR tablet 40 mEq (40 mEq Oral Given 07/26/18 1908)  calcium gluconate 1 g in sodium chloride 0.9 % 100 mL IVPB (0 g Intravenous Stopped 07/26/18 2030)    Mobility walks Low fall risk

## 2018-07-26 NOTE — ED Triage Notes (Signed)
Patient presented to ed with c/o incontinence and neck pain. Patient state she recently had a cold and she is not getting better and that have cause her to developed incontinence. Patient also complain of generalized weakness.

## 2018-07-26 NOTE — ED Notes (Signed)
Date and time results received: 07/26/18 1846 (use smartphrase ".now" to insert current time)  Test: K+ <2.0 Critical Value: calcium 5.9  Name of Provider Notified: Dr Johnette Abraham. Eulis Foster  Orders Received? Or Actions Taken?: reported K+ <2.0, calcium 5.9 to Dr E. Wentz.

## 2018-07-26 NOTE — ED Notes (Signed)
Report given to [place], RN on 2W.

## 2018-07-26 NOTE — ED Notes (Signed)
Tech attempted obtaining urine specimen via Bedside Commode and [place]. Was unsuccessful both times. Pt unable to urinate at this time. Will try again later.

## 2018-07-26 NOTE — ED Provider Notes (Signed)
[facility] DEPT Provider Note   CSN: [id number] Arrival date & time: 07/26/18  1452    History   Chief Complaint Chief Complaint  Patient presents with   Neck Pain    Incontience    HPI Deborah Cabrera is a 57 y.o. female.     HPI   She presents for evaluation of generalized weakness, leg swelling, left neck pain, decreased appetite, and bowel and urinary incontinence which started last night.  She drinks brandy regularly.  She denies nausea, vomiting, fever, chills, focal weakness or paresthesia.  She has not previously had this constellation of problems.  She sees her doctor regularly.  She is taking her usual medication for high blood pressure.  There are no other known modifying factors.  Past Medical History:  Diagnosis Date   Anemia    Hgb 4 01/2013 due to menometorhagia   Menometrorrhagia    Pyelonephritis 01/16/2013   R PN -Ecoli - hosp for same   Thrombocytopenia ([place]) 01/16/13   in setting of pyelonephritis hosp, 1 plt pk transfusion when plt 27K   Uterine fibroids    dx 01/2013 by [place] in setting of severe iron def anemia    Patient Active Problem List   Diagnosis Date Noted   Pain in both wrists 10/07/2017   Muscle cramping 07/17/2016   Family history of diabetes mellitus 07/17/2016   Mitral regurgitation, trivial 40/02/2724   Diastolic dysfunction without heart failure 01/17/2016   Overweight 06/29/2014   Transient synovitis of right knee 02/18/2014   Hypertension    Tobacco use disorder    Arthritis of knee 01/31/2013   Uterine fibroids 01/19/2013    Past Surgical History:  Procedure Laterality Date   TUBAL LIGATION Bilateral      OB History   No obstetric history on file.      Home Medications    Prior to Admission medications   Medication Sig Start Date End Date Taking? Authorizing Provider  losartan (COZAAR) 50 MG tablet Take 1 tablet (50 mg total) by mouth daily. 10/07/17  Yes Burns, Claudina Lick,  MD  Magnesium 250 MG TABS Take 2 tablets by mouth 2 (two) times daily.   Yes [provider]  Multiple Vitamin (MULTIVITAMIN WITH MINERALS) TABS tablet Take 1 tablet by mouth daily.   Yes [provider]  Omega-3 1000 MG CAPS Taking daily 07/17/16  Yes Burns, Claudina Lick, MD  Potassium Gluconate 550 MG TABS Take 2 tablets by mouth 2 (two) times daily.   Yes [provider]    Family History Family History  Problem Relation Age of Onset   Hypertension Mother    Osteoarthritis Mother    Hyperlipidemia Mother    Kidney failure Father        ESRD since age 65   Alcohol abuse Father    Dementia Father    Ovarian cancer Sister 16       died of same age 90   Dementia Paternal Grandmother    Stroke Maternal Grandmother 52   Diabetes Mellitus II Son    Diabetes Sister    Colon cancer Neg Hx     Social History Social History   Tobacco Use   Smoking status: Current Some Day Smoker    Types: Cigarettes   Smokeless tobacco: Never Used  Substance Use Topics   Alcohol use: Yes    Alcohol/week: 0.0 standard drinks    Comment: Socially   Drug use: No    Frequency:  2.0 times per week     Allergies   Patient has no known allergies.   Review of Systems Review of Systems  All other systems reviewed and are negative.    Physical Exam Updated Vital Signs BP 126/81    Pulse 100    Temp 98.9 F (37.2 C) (Oral)    Resp 19    SpO2 98%   Physical Exam Vitals signs and nursing note reviewed.  Constitutional:      General: She is not in acute distress.    Appearance: She is well-developed. She is not ill-appearing, toxic-appearing or diaphoretic.     Comments: Frail, she appears older than stated age.  HENT:     Head: Normocephalic and atraumatic.     Right Ear: External ear normal.     Left Ear: External ear normal.  Eyes:     Conjunctiva/sclera: Conjunctivae normal.     Pupils: Pupils are equal, round, and reactive to light.  Neck:      Musculoskeletal: Normal range of motion and neck supple.     Trachea: Phonation normal.  Cardiovascular:     Rate and Rhythm: Normal rate and regular rhythm.     Heart sounds: Normal heart sounds.  Pulmonary:     Effort: Pulmonary effort is normal.     Breath sounds: Normal breath sounds.  Abdominal:     General: There is no distension.     Palpations: Abdomen is soft.     Tenderness: There is no abdominal tenderness. There is no guarding.     Hernia: No hernia is present.     Comments: Mass right upper quadrant consistent with hepatomegaly.  Musculoskeletal: Normal range of motion.     Right lower leg: Edema present.     Left lower leg: Edema present.     Comments: 2-3+ bilateral lower extremity swelling.  Skin:    General: Skin is warm and dry.     Comments: Dry skin of toes, bilaterally; skin is thickened, toes warm, and somewhat stiff.  Some sloughing of the skin of the left plantar foot.  Neurological:     Mental Status: She is alert and oriented to person, place, and time.     Cranial Nerves: No cranial nerve deficit.     Sensory: No sensory deficit.     Motor: No abnormal muscle tone.     Coordination: Coordination normal.  Psychiatric:        Mood and Affect: Mood normal.        Behavior: Behavior normal.        Thought Content: Thought content normal.        Judgment: Judgment normal.      ED Treatments / Results  Labs (all labs ordered are listed, but only abnormal results are displayed) Labs Reviewed  COMPREHENSIVE METABOLIC PANEL - Abnormal; Notable for the following components:      Result Value   Potassium <2.0 (*)    Chloride 85 (*)    CO2 36 (*)    Glucose, Bld 106 (*)    Calcium 5.9 (*)    Albumin 2.5 (*)    AST 362 (*)    ALT 126 (*)    Alkaline Phosphatase 295 (*)    Total Bilirubin 3.7 (*)    Anion gap 21 (*)    All other components within normal limits  CBC WITH DIFFERENTIAL/PLATELET - Abnormal; Notable for the following components:   RBC  3.22 (*)    Hemoglobin 11.0 (*)  HCT 32.7 (*)    MCV 101.6 (*)    MCH 34.2 (*)    RDW 19.2 (*)    nRBC 0.7 (*)    All other components within normal limits  SEDIMENTATION RATE - Abnormal; Notable for the following components:   Sed Rate 66 (*)    All other components within normal limits  PROTIME-INR - Abnormal; Notable for the following components:   Prothrombin Time 17.1 (*)    INR 1.4 (*)    All other components within normal limits  URINE CULTURE  ETHANOL  URINALYSIS, ROUTINE W REFLEX MICROSCOPIC  RAPID URINE DRUG SCREEN, HOSP PERFORMED    EKG EKG Interpretation  Date/Time:  Saturday July 26 2018 18:56:11 EDT Ventricular Rate:  90 PR Interval:    QRS Duration: 85 QT Interval:  390 QTC Calculation: 478 R Axis:   -15 Text Interpretation:  Sinus or ectopic atrial rhythm Prolonged PR interval Borderline left axis deviation Repol abnrm suggests ischemia, diffuse leads No old tracing to compare Confirmed by Daleen Bo [phone]) on 07/26/2018 7:06:30 PM   Radiology Dg Chest 2 View  Result Date: 07/26/2018 CLINICAL DATA:  Chest pain EXAM: CHEST - 2 VIEW COMPARISON:  01/16/2013 FINDINGS: The heart size and mediastinal contours are within normal limits. Both lungs are clear. The visualized skeletal structures are unremarkable. IMPRESSION: No active cardiopulmonary disease. Electronically Signed   By: Inez Catalina M.D.   On: 07/26/2018 16:36   Ct Head Wo Contrast  Result Date: 07/26/2018 CLINICAL DATA:  General weakness and neck pain. Woke up with neck pain. EXAM: CT HEAD WITHOUT CONTRAST CT CERVICAL SPINE WITHOUT CONTRAST TECHNIQUE: Multidetector CT imaging of the head and cervical spine was performed following the standard protocol without intravenous contrast. Multiplanar CT image reconstructions of the cervical spine were also generated. COMPARISON:  Head CT dated 01/17/2013. FINDINGS: CT HEAD FINDINGS Brain: Ventricles are stable in size and configuration. There is no mass,  hemorrhage, edema or other evidence of acute parenchymal abnormality. No extra-axial hemorrhage. Vascular: No hyperdense vessel or unexpected calcification. Skull: Normal. Negative for fracture or focal lesion. Sinuses/Orbits: Visualized paranasal sinuses are clear. Periorbital and retro-orbital soft tissues are unremarkable. Other: None. CT CERVICAL SPINE FINDINGS Alignment: Mild levoscoliosis which may be accentuated by patient positioning. Slight reversal of the normal cervical spine lordosis which could be related to patient positioning or muscle spasm. No evidence of acute vertebral body subluxation. Skull base and vertebrae: No fracture line or displaced fracture fragment seen. No acute or suspicious osseous lesion. Soft tissues and spinal canal: No prevertebral soft tissue swelling or edema. No hemorrhage seen within the central canal. Disc levels: Mild degenerative spondylosis within the mid to lower cervical spine, most prominent at the C5-6 level with associated disc space narrowing and osseous spurring. Associated chronic appearing wedging of the C5 vertebral body. No more than mild central canal stenosis at any level. Upper chest: Negative. Other: Bilateral carotid atherosclerosis, RIGHT greater than LEFT. IMPRESSION: 1. Negative head CT. No intracranial mass, hemorrhage or edema. No skull fracture. 2. No fracture or acute subluxation within the cervical spine. Mild degenerative change within the mid to lower cervical spine, most prominent at the C5-6 level. 3. Carotid atherosclerosis, right greater than left. Electronically Signed   By: Franki Cabot M.D.   On: 07/26/2018 16:50   Ct Cervical Spine Wo Contrast  Result Date: 07/26/2018 CLINICAL DATA:  General weakness and neck pain. Woke up with neck pain. EXAM: CT HEAD WITHOUT CONTRAST CT CERVICAL SPINE  WITHOUT CONTRAST TECHNIQUE: Multidetector CT imaging of the head and cervical spine was performed following the standard protocol without intravenous  contrast. Multiplanar CT image reconstructions of the cervical spine were also generated. COMPARISON:  Head CT dated 01/17/2013. FINDINGS: CT HEAD FINDINGS Brain: Ventricles are stable in size and configuration. There is no mass, hemorrhage, edema or other evidence of acute parenchymal abnormality. No extra-axial hemorrhage. Vascular: No hyperdense vessel or unexpected calcification. Skull: Normal. Negative for fracture or focal lesion. Sinuses/Orbits: Visualized paranasal sinuses are clear. Periorbital and retro-orbital soft tissues are unremarkable. Other: None. CT CERVICAL SPINE FINDINGS Alignment: Mild levoscoliosis which may be accentuated by patient positioning. Slight reversal of the normal cervical spine lordosis which could be related to patient positioning or muscle spasm. No evidence of acute vertebral body subluxation. Skull base and vertebrae: No fracture line or displaced fracture fragment seen. No acute or suspicious osseous lesion. Soft tissues and spinal canal: No prevertebral soft tissue swelling or edema. No hemorrhage seen within the central canal. Disc levels: Mild degenerative spondylosis within the mid to lower cervical spine, most prominent at the C5-6 level with associated disc space narrowing and osseous spurring. Associated chronic appearing wedging of the C5 vertebral body. No more than mild central canal stenosis at any level. Upper chest: Negative. Other: Bilateral carotid atherosclerosis, RIGHT greater than LEFT. IMPRESSION: 1. Negative head CT. No intracranial mass, hemorrhage or edema. No skull fracture. 2. No fracture or acute subluxation within the cervical spine. Mild degenerative change within the mid to lower cervical spine, most prominent at the C5-6 level. 3. Carotid atherosclerosis, right greater than left. Electronically Signed   By: Franki Cabot M.D.   On: 07/26/2018 16:50    Procedures .Critical Care Performed by: Daleen Bo, MD Authorized by: Daleen Bo, MD    Critical care provider statement:    Critical care time (minutes):  35   Critical care start time:  07/26/2018 3:40 PM   Critical care end time:  07/26/2018 7:30 PM   Critical care time was exclusive of:  Separately billable procedures and treating other patients   Critical care was necessary to treat or prevent imminent or life-threatening deterioration of the following conditions:  Endocrine crisis   Critical care was time spent personally by me on the following activities:  Blood draw for specimens, development of treatment plan with patient or surrogate, discussions with consultants, evaluation of patient's response to treatment, examination of patient, obtaining history from patient or surrogate, ordering and performing treatments and interventions, ordering and review of laboratory studies, pulse oximetry, re-evaluation of patient's condition, review of old charts and ordering and review of radiographic studies   (including critical care time)  Medications Ordered in ED Medications  potassium chloride 10 mEq in 100 mL IVPB (10 mEq Intravenous New Bag/Given 07/26/18 1910)  calcium gluconate 1 g in sodium chloride 0.9 % 100 mL IVPB (1 g Intravenous New Bag/Given 07/26/18 1929)  potassium chloride SA (K-DUR,KLOR-CON) CR tablet 40 mEq (40 mEq Oral Given 07/26/18 1908)     Initial Impression / Assessment and Plan / ED Course  I have reviewed the triage vital signs and the nursing notes.  Pertinent labs & imaging results that were available during my care of the patient were reviewed by me and considered in my medical decision making (see chart for details).  Clinical Course as of Jul 25 1952  Sat Jul 26, 2018  1710 No intracranial abnormality, images reviewed by me  CT Head Wo Contrast [EW]  1711 Degenerative changes in C5-6, images reviewed by me  CT Cervical Spine Wo Contrast [EW]  1711 No infiltrate or CHF, images reviewed by me  DG Chest 2 View [EW]  1835 Normal  Ethanol [EW]    1836 Normal except hemoglobin low, MCV high  CBC with Differential(!) [EW]  1836 Elevated  Protime-INR(!) [EW]  1837 No acute abnormality, images reviewed by me  CT Head Wo Contrast [EW]  1837 Mild DJD, no fracture or dislocation, images reviewed by me  CT Cervical Spine Wo Contrast [EW]  1837 No infiltrate or CHF, images reviewed by me  DG Chest 2 View [EW]    Clinical Course User Index [EW] Daleen Bo, MD        Patient Vitals for the past 24 hrs:  BP Temp Temp src Pulse Resp SpO2  07/26/18 1814 126/81 -- -- 100 -- 98 %  07/26/18 1812 107/75 -- -- 94 19 100 %  07/26/18 1811 115/63 -- -- 73 20 100 %  07/26/18 1514 113/83 98.9 F (37.2 C) Oral 92 16 98 %  07/26/18 1502 -- -- -- -- -- 92 %    7:32 PM Reevaluation with update and discussion. After initial assessment and treatment, an updated evaluation reveals no change in clinical status.  She remains alert and cooperative.  Findings discussed and questions answered. Daleen Bo   Medical Decision Making: Patient with profound weakness gradual onset over the last 3 weeks.  She has marked hypokalemia and hypocalcemia.  This is likely the main source of her primary weakness.  Doubt CVA, spinal myelopathy, acute coronary syndrome.  She requires admission for stabilization and further treatment.  I suspect that she is very heavy alcohol drinker, with alcoholism, leading to transaminitis along with hepatomegaly.  No clear evidence for cirrhosis or ascites at this time.  CRITICAL CARE-yes Performed by: Daleen Bo  Nursing Notes Reviewed/ Care Coordinated Applicable Imaging Reviewed Interpretation of Laboratory Data incorporated into ED treatment  7:33 PM-Consult complete with hospitalist. Patient case explained and discussed.  He agrees to admit patient for further evaluation and treatment. Call ended at 7:52 PM  Plan: Admit   Final Clinical Impressions(s) / ED Diagnoses   Final diagnoses:  Hypokalemia   Hypocalcemia  Weakness  Alcoholic liver disease ([place])  Peripheral edema  Osteoarthritis of cervical spine, unspecified spinal osteoarthritis complication status  Hepatomegaly    ED Discharge Orders    None       Daleen Bo, MD 07/26/18 1954

## 2018-07-26 NOTE — ED Notes (Signed)
Bed: EW25 Expected date:  Expected time:  Means of arrival:  Comments: 59 yo neck pain/incontinence

## 2018-07-26 NOTE — H&P (Signed)
History and Physical   Deborah Cabrera ZOX:[id number] DOB: October 06, 1960 DOA: 07/26/2018  Referring MD/NP/PA: Dr. Daleen Bo  PCP: Binnie Rail, MD   Outpatient Specialists: None  Patient coming from: Home  Chief Complaint: Generalized weakness, neck pain and incontinence  HPI: Deborah Cabrera is a 59 y.o. female with medical history significant of hypertension, hyperlipidemia, alcohol abuse drinking brandy every night, anemia of chronic blood loss from uterine fibroids, morbid obesity who came to the ER with generalized weakness, leg swellings as well as neck pain.  This been going on for about a day.  Patient has also noted incontinence to both urine and feces which is new.  She was weak not able to make it to the bathroom.  She was previously doing fine with no issues regarding her mobility or activities.  Patient was found to be profoundly hypokalemic with potassium of less than 2.  She is now being admitted to the hospital for work-up..  ED Course: Vitals are generally stable.  Sodium is 142 potassium is less than 2 chloride 85 CO2 36 glucose 106 BUN 10 creatinine 0.98.  Her calcium is 5.9 with albumin of 2.5 alkaline phosphatase of 295 AST 362 ALT 126.  White count 6.1 hemoglobin 11.0 platelets 203.  Alcohol level is less than 10.  CT cervical spine as well as head CT without contrast were negative chest x-ray also negative.  EKG showed normal sinus rhythm with a rate of 90, flipped T waves in the lateral leads, prolonged PR interval, no peaked T waves.  No old EKG to compare.  Patient was given IV potassium as well as oral potassium and is being admitted to the hospital for treatment.  Review of Systems: As per HPI otherwise 10 point review of systems negative.    Past Medical History:  Diagnosis Date   Anemia    Hgb 4 01/2013 due to menometorhagia   Menometrorrhagia    Pyelonephritis 01/16/2013   R PN -Ecoli - hosp for same   Thrombocytopenia ([place]) 01/16/13   in setting of  pyelonephritis hosp, 1 plt pk transfusion when plt 27K   Uterine fibroids    dx 01/2013 by [place] in setting of severe iron def anemia    Past Surgical History:  Procedure Laterality Date   TUBAL LIGATION Bilateral      reports that she has been smoking cigarettes. She has never used smokeless tobacco. She reports current alcohol use. She reports that she does not use drugs.  No Known Allergies  Family History  Problem Relation Age of Onset   Hypertension Mother    Osteoarthritis Mother    Hyperlipidemia Mother    Kidney failure Father        ESRD since age 13   Alcohol abuse Father    Dementia Father    Ovarian cancer Sister 21       died of same age 27   Dementia Paternal Grandmother    Stroke Maternal Grandmother 33   Diabetes Mellitus II Son    Diabetes Sister    Colon cancer Neg Hx      Prior to Admission medications   Medication Sig Start Date End Date Taking? Authorizing Provider  losartan (COZAAR) 50 MG tablet Take 1 tablet (50 mg total) by mouth daily. 10/07/17  Yes Burns, Claudina Lick, MD  Magnesium 250 MG TABS Take 2 tablets by mouth 2 (two) times daily.   Yes [provider]  Multiple Vitamin (MULTIVITAMIN WITH MINERALS) TABS tablet  Take 1 tablet by mouth daily.   Yes [provider]  Omega-3 1000 MG CAPS Taking daily 07/17/16  Yes Burns, Claudina Lick, MD  Potassium Gluconate 550 MG TABS Take 2 tablets by mouth 2 (two) times daily.   Yes [provider]    Physical Exam: Vitals:   07/26/18 1812 07/26/18 1814 07/26/18 1930 07/26/18 2000  BP: 107/75 126/81 108/82 110/79  Pulse: 94 100 86 88  Resp: 19  17   Temp:      TempSrc:      SpO2: 100% 98% 97% 95%      Constitutional: NAD, calm, comfortable Vitals:   07/26/18 1812 07/26/18 1814 07/26/18 1930 07/26/18 2000  BP: 107/75 126/81 108/82 110/79  Pulse: 94 100 86 88  Resp: 19  17   Temp:      TempSrc:      SpO2: 100% 98% 97% 95%   Eyes: PERRL, lids and conjunctivae  normal ENMT: Mucous membranes are moist. Posterior pharynx clear of any exudate or lesions.Normal dentition.  Neck: normal, supple, no masses, no thyromegaly Respiratory: clear to auscultation bilaterally, no wheezing, no crackles. Normal respiratory effort. No accessory muscle use.  Cardiovascular: Regular rate and rhythm, no murmurs / rubs / gallops. 1+ extremity edema. 2+ pedal pulses. No carotid bruits.  Abdomen: Distended, no tenderness, no masses palpated. No hepatosplenomegaly. Bowel sounds positive.  Musculoskeletal: no clubbing / cyanosis. No joint deformity upper and lower extremities. Good ROM, no contractures. Normal muscle tone.  Skin: no rashes, lesions, ulcers. No induration Neurologic: CN 2-12 grossly intact. Sensation intact, DTR normal. Strength 5/5 in all 4.  Psychiatric: Normal judgment and insight. Alert and oriented x 3. Normal mood.  No tremors or any signs of alcohol withdrawal    Labs on Admission: I have personally reviewed following labs and imaging studies  CBC: Recent Labs  Lab 07/26/18 1600  WBC 6.1  NEUTROABS 4.3  HGB 11.0*  HCT 32.7*  MCV 101.6*  PLT 496   Basic Metabolic Panel: Recent Labs  Lab 07/26/18 1600  NA 142  K <2.0*  CL 85*  CO2 36*  GLUCOSE 106*  BUN 10  CREATININE 0.98  CALCIUM 5.9*   GFR: CrCl cannot be calculated (Unknown ideal weight.). Liver Function Tests: Recent Labs  Lab 07/26/18 1600  AST 362*  ALT 126*  ALKPHOS 295*  BILITOT 3.7*  PROT 6.6  ALBUMIN 2.5*   No results for input(s): LIPASE, AMYLASE in the last 168 hours. No results for input(s): AMMONIA in the last 168 hours. Coagulation Profile: Recent Labs  Lab 07/26/18 1600  INR 1.4*   Cardiac Enzymes: No results for input(s): CKTOTAL, CKMB, CKMBINDEX, TROPONINI in the last 168 hours. BNP (last 3 results) No results for input(s): PROBNP in the last 8760 hours. HbA1C: No results for input(s): HGBA1C in the last 72 hours. CBG: No results for  input(s): GLUCAP in the last 168 hours. Lipid Profile: No results for input(s): CHOL, HDL, LDLCALC, TRIG, CHOLHDL, LDLDIRECT in the last 72 hours. Thyroid Function Tests: No results for input(s): TSH, T4TOTAL, FREET4, T3FREE, THYROIDAB in the last 72 hours. Anemia Panel: No results for input(s): VITAMINB12, FOLATE, FERRITIN, TIBC, IRON, RETICCTPCT in the last 72 hours. Urine analysis:    Component Value Date/Time   COLORURINE YELLOW 06/29/2014 1023   APPEARANCEUR CLEAR 06/29/2014 1023   LABSPEC 1.015 06/29/2014 1023   PHURINE 6.0 06/29/2014 1023   GLUCOSEU NEGATIVE 06/29/2014 1023   HGBUR SMALL (A) 06/29/2014 1023   BILIRUBINUR  NEGATIVE 06/29/2014 1023   BILIRUBINUR neg 12/19/2013 1001   KETONESUR 15 (A) 06/29/2014 1023   PROTEINUR trace 12/19/2013 1001   PROTEINUR 30 (A) 01/16/2013 0902   UROBILINOGEN 0.2 06/29/2014 1023   NITRITE NEGATIVE 06/29/2014 1023   LEUKOCYTESUR NEGATIVE 06/29/2014 1023   Sepsis Labs: @LABRCNTIP (procalcitonin:4,lacticidven:4) )No results found for this or any previous visit (from the past 240 hour(s)).   Radiological Exams on Admission: Dg Chest 2 View  Result Date: 07/26/2018 CLINICAL DATA:  Chest pain EXAM: CHEST - 2 VIEW COMPARISON:  01/16/2013 FINDINGS: The heart size and mediastinal contours are within normal limits. Both lungs are clear. The visualized skeletal structures are unremarkable. IMPRESSION: No active cardiopulmonary disease. Electronically Signed   By: Inez Catalina M.D.   On: 07/26/2018 16:36   Ct Head Wo Contrast  Result Date: 07/26/2018 CLINICAL DATA:  General weakness and neck pain. Woke up with neck pain. EXAM: CT HEAD WITHOUT CONTRAST CT CERVICAL SPINE WITHOUT CONTRAST TECHNIQUE: Multidetector CT imaging of the head and cervical spine was performed following the standard protocol without intravenous contrast. Multiplanar CT image reconstructions of the cervical spine were also generated. COMPARISON:  Head CT dated 01/17/2013.  FINDINGS: CT HEAD FINDINGS Brain: Ventricles are stable in size and configuration. There is no mass, hemorrhage, edema or other evidence of acute parenchymal abnormality. No extra-axial hemorrhage. Vascular: No hyperdense vessel or unexpected calcification. Skull: Normal. Negative for fracture or focal lesion. Sinuses/Orbits: Visualized paranasal sinuses are clear. Periorbital and retro-orbital soft tissues are unremarkable. Other: None. CT CERVICAL SPINE FINDINGS Alignment: Mild levoscoliosis which may be accentuated by patient positioning. Slight reversal of the normal cervical spine lordosis which could be related to patient positioning or muscle spasm. No evidence of acute vertebral body subluxation. Skull base and vertebrae: No fracture line or displaced fracture fragment seen. No acute or suspicious osseous lesion. Soft tissues and spinal canal: No prevertebral soft tissue swelling or edema. No hemorrhage seen within the central canal. Disc levels: Mild degenerative spondylosis within the mid to lower cervical spine, most prominent at the C5-6 level with associated disc space narrowing and osseous spurring. Associated chronic appearing wedging of the C5 vertebral body. No more than mild central canal stenosis at any level. Upper chest: Negative. Other: Bilateral carotid atherosclerosis, RIGHT greater than LEFT. IMPRESSION: 1. Negative head CT. No intracranial mass, hemorrhage or edema. No skull fracture. 2. No fracture or acute subluxation within the cervical spine. Mild degenerative change within the mid to lower cervical spine, most prominent at the C5-6 level. 3. Carotid atherosclerosis, right greater than left. Electronically Signed   By: Franki Cabot M.D.   On: 07/26/2018 16:50   Ct Cervical Spine Wo Contrast  Result Date: 07/26/2018 CLINICAL DATA:  General weakness and neck pain. Woke up with neck pain. EXAM: CT HEAD WITHOUT CONTRAST CT CERVICAL SPINE WITHOUT CONTRAST TECHNIQUE: Multidetector CT  imaging of the head and cervical spine was performed following the standard protocol without intravenous contrast. Multiplanar CT image reconstructions of the cervical spine were also generated. COMPARISON:  Head CT dated 01/17/2013. FINDINGS: CT HEAD FINDINGS Brain: Ventricles are stable in size and configuration. There is no mass, hemorrhage, edema or other evidence of acute parenchymal abnormality. No extra-axial hemorrhage. Vascular: No hyperdense vessel or unexpected calcification. Skull: Normal. Negative for fracture or focal lesion. Sinuses/Orbits: Visualized paranasal sinuses are clear. Periorbital and retro-orbital soft tissues are unremarkable. Other: None. CT CERVICAL SPINE FINDINGS Alignment: Mild levoscoliosis which may be accentuated by patient positioning. Slight reversal of  the normal cervical spine lordosis which could be related to patient positioning or muscle spasm. No evidence of acute vertebral body subluxation. Skull base and vertebrae: No fracture line or displaced fracture fragment seen. No acute or suspicious osseous lesion. Soft tissues and spinal canal: No prevertebral soft tissue swelling or edema. No hemorrhage seen within the central canal. Disc levels: Mild degenerative spondylosis within the mid to lower cervical spine, most prominent at the C5-6 level with associated disc space narrowing and osseous spurring. Associated chronic appearing wedging of the C5 vertebral body. No more than mild central canal stenosis at any level. Upper chest: Negative. Other: Bilateral carotid atherosclerosis, RIGHT greater than LEFT. IMPRESSION: 1. Negative head CT. No intracranial mass, hemorrhage or edema. No skull fracture. 2. No fracture or acute subluxation within the cervical spine. Mild degenerative change within the mid to lower cervical spine, most prominent at the C5-6 level. 3. Carotid atherosclerosis, right greater than left. Electronically Signed   By: Franki Cabot M.D.   On: 07/26/2018  16:50    EKG: Independently reviewed.  Normal sinus rhythm with a rate of 90, prolonged PR interval, low voltage, flipped T waves in most leads, no old EKG to compare  Assessment/Plan Principal Problem:   Hypokalemia Active Problems:   Hypertension   Tobacco use disorder   Alcohol abuse   Hypocalcemia     #1 severe hypokalemia: Most likely secondary to alcohol abuse.  Potassium is less than 2.  Patient will be admitted to the unit.  She is getting potassium supplementation with K riders.  Also oral potassium has been given.  She will get IV fluids with corresponding potassium.  Goal is to replete about 10 mEq/h.  Admit to stepdown unit.  #2 alcohol abuse: Currently alcohol level is less than 10.  She is at risk for alcohol withdrawal.  Drinking multiple brandy every night.  We will initiate CIWA protocol.  #3 tobacco abuse: Add nicotine patch was tobacco cessation counseling.  #4 hypocalcemia: Corrected calcium is 7.1 which is well below the normal range.  Patient will be given supplemental calcium.  #5 hypertension: Blood pressure at this point is controlled.  I will continue with home regimen.  #6 morbid obesity: Dietary counseling.  #7 lower extremity edema with elevated liver enzymes: Most likely due to alcoholism.  Patient may have cirrhosis.  Right upper quadrant abdominal ultrasound will be ordered.   DVT prophylaxis: Heparin Code Status: Full code Family Communication: Sister at bedside Disposition Plan: To be determined Consults called: None Admission status: Inpatient to stepdown unit  Severity of Illness: The appropriate patient status for this patient is INPATIENT. Inpatient status is judged to be reasonable and necessary in order to provide the required intensity of service to ensure the patient's safety. The patient's presenting symptoms, physical exam findings, and initial radiographic and laboratory data in the context of their chronic comorbidities is felt to  place them at high risk for further clinical deterioration. Furthermore, it is not anticipated that the patient will be medically stable for discharge from the hospital within 2 midnights of admission. The following factors support the patient status of inpatient.   " The patient's presenting symptoms include generalized weakness and incontinence. " The worrisome physical exam findings include trace pedal edema with distended abdomen. " The initial radiographic and laboratory data are worrisome because of potassium less than 2. " The chronic co-morbidities include alcohol abuse.   * I certify that at the point of admission it is my  clinical judgment that the patient will require inpatient hospital care spanning beyond 2 midnights from the point of admission due to high intensity of service, high risk for further deterioration and high frequency of surveillance required.Barbette Merino MD [facility] Pager [phone]  If 7PM-7AM, please contact night-coverage [email or web address] Password [email or web address]  07/26/2018, 8:30 PM

## 2018-07-27 ENCOUNTER — Inpatient Hospital Stay (HOSPITAL_COMMUNITY): Payer: Self-pay

## 2018-07-27 ENCOUNTER — Encounter (HOSPITAL_COMMUNITY): Payer: Self-pay | Admitting: *Deleted

## 2018-07-27 DIAGNOSIS — I10 Essential (primary) hypertension: Secondary | ICD-10-CM

## 2018-07-27 DIAGNOSIS — R609 Edema, unspecified: Secondary | ICD-10-CM

## 2018-07-27 DIAGNOSIS — R160 Hepatomegaly, not elsewhere classified: Secondary | ICD-10-CM

## 2018-07-27 DIAGNOSIS — R945 Abnormal results of liver function studies: Secondary | ICD-10-CM

## 2018-07-27 DIAGNOSIS — F1010 Alcohol abuse, uncomplicated: Secondary | ICD-10-CM

## 2018-07-27 DIAGNOSIS — F17200 Nicotine dependence, unspecified, uncomplicated: Secondary | ICD-10-CM

## 2018-07-27 DIAGNOSIS — K709 Alcoholic liver disease, unspecified: Secondary | ICD-10-CM

## 2018-07-27 LAB — CBC
HCT: 33.6 % — ABNORMAL LOW (ref 36.0–46.0)
HCT: 31.6 % — ABNORMAL LOW (ref 36.0–46.0)
HEMOGLOBIN: 10.1 g/dL — AB (ref 12.0–15.0)
HEMOGLOBIN: 10.9 g/dL — AB (ref 12.0–15.0)
MCH: 33.6 pg (ref 26.0–34.0)
MCH: 33 pg (ref 26.0–34.0)
MCHC: 32.4 g/dL (ref 30.0–36.0)
MCHC: 32 g/dL (ref 30.0–36.0)
MCV: 103.7 fL — ABNORMAL HIGH (ref 80.0–100.0)
MCV: 103.3 fL — ABNORMAL HIGH (ref 80.0–100.0)
Platelets: 212 10*3/uL (ref 150–400)
Platelets: 197 10*3/uL (ref 150–400)
RBC: 3.24 MIL/uL — ABNORMAL LOW (ref 3.87–5.11)
RBC: 3.06 MIL/uL — ABNORMAL LOW (ref 3.87–5.11)
RDW: 18.9 % — ABNORMAL HIGH (ref 11.5–15.5)
RDW: 19.1 % — ABNORMAL HIGH (ref 11.5–15.5)
WBC: 5.7 10*3/uL (ref 4.0–10.5)
WBC: 5.8 10*3/uL (ref 4.0–10.5)
nRBC: 0.5 % — ABNORMAL HIGH (ref 0.0–0.2)
nRBC: 0.5 % — ABNORMAL HIGH (ref 0.0–0.2)

## 2018-07-27 LAB — BASIC METABOLIC PANEL
ANION GAP: 16 — AB (ref 5–15)
Anion gap: 18 — ABNORMAL HIGH (ref 5–15)
Anion gap: 18 — ABNORMAL HIGH (ref 5–15)
Anion gap: 18 — ABNORMAL HIGH (ref 5–15)
Anion gap: 18 — ABNORMAL HIGH (ref 5–15)
BUN: 8 mg/dL (ref 6–20)
BUN: 9 mg/dL (ref 6–20)
BUN: 8 mg/dL (ref 6–20)
BUN: 7 mg/dL (ref 6–20)
BUN: 7 mg/dL (ref 6–20)
CALCIUM: 6.1 mg/dL — AB (ref 8.9–10.3)
CO2: 29 mmol/L (ref 22–32)
CO2: 33 mmol/L — AB (ref 22–32)
CO2: 33 mmol/L — AB (ref 22–32)
CO2: 33 mmol/L — ABNORMAL HIGH (ref 22–32)
CO2: 36 mmol/L — ABNORMAL HIGH (ref 22–32)
CREATININE: 0.85 mg/dL (ref 0.44–1.00)
Calcium: 6.1 mg/dL — CL (ref 8.9–10.3)
Calcium: 6 mg/dL — CL (ref 8.9–10.3)
Calcium: 6.5 mg/dL — ABNORMAL LOW (ref 8.9–10.3)
Calcium: 6.2 mg/dL — CL (ref 8.9–10.3)
Chloride: 88 mmol/L — ABNORMAL LOW (ref 98–111)
Chloride: 91 mmol/L — ABNORMAL LOW (ref 98–111)
Chloride: 90 mmol/L — ABNORMAL LOW (ref 98–111)
Chloride: 91 mmol/L — ABNORMAL LOW (ref 98–111)
Chloride: 95 mmol/L — ABNORMAL LOW (ref 98–111)
Creatinine, Ser: 0.93 mg/dL (ref 0.44–1.00)
Creatinine, Ser: 0.8 mg/dL (ref 0.44–1.00)
Creatinine, Ser: 0.92 mg/dL (ref 0.44–1.00)
Creatinine, Ser: 0.88 mg/dL (ref 0.44–1.00)
GFR calc Af Amer: >60 mL/min (ref >60)
GFR calc Af Amer: >60 mL/min (ref >60)
GFR calc Af Amer: >60 mL/min (ref >60)
GFR calc Af Amer: >60 mL/min (ref >60)
GFR calc non Af Amer: >60 mL/min (ref >60)
GFR calc non Af Amer: >60 mL/min (ref >60)
GFR calc non Af Amer: >60 mL/min (ref >60)
GFR calc non Af Amer: >60 mL/min (ref >60)
GLUCOSE: 128 mg/dL — AB (ref 70–99)
Glucose, Bld: 114 mg/dL — ABNORMAL HIGH (ref 70–99)
Glucose, Bld: 160 mg/dL — ABNORMAL HIGH (ref 70–99)
Glucose, Bld: 142 mg/dL — ABNORMAL HIGH (ref 70–99)
Glucose, Bld: 126 mg/dL — ABNORMAL HIGH (ref 70–99)
Potassium: >2 mmol/L — CL (ref 3.5–5.1)
Potassium: <2 mmol/L — CL (ref 3.5–5.1)
Potassium: <2 mmol/L — CL (ref 3.5–5.1)
Potassium: <2 mmol/L — CL (ref 3.5–5.1)
Potassium: 2.9 mmol/L — ABNORMAL LOW (ref 3.5–5.1)
Sodium: 142 mmol/L (ref 135–145)
Sodium: 142 mmol/L (ref 135–145)
Sodium: 141 mmol/L (ref 135–145)
Sodium: 142 mmol/L (ref 135–145)
Sodium: 140 mmol/L (ref 135–145)

## 2018-07-27 LAB — MAGNESIUM: Magnesium: 1.1 mg/dL — ABNORMAL LOW (ref 1.7–2.4)

## 2018-07-27 LAB — VITAMIN B12: Vitamin B-12: 1047 pg/mL — ABNORMAL HIGH (ref 180–914)

## 2018-07-27 LAB — RETICULOCYTES
Immature Retic Fract: 36.7 % — ABNORMAL HIGH (ref 2.3–15.9)
RBC.: 2.93 MIL/uL — ABNORMAL LOW (ref 3.87–5.11)
Retic Count, Absolute: 46 10*3/uL (ref 19.0–186.0)
Retic Ct Pct: 1.6 % (ref 0.4–3.1)

## 2018-07-27 LAB — IRON AND TIBC
Iron: 97 ug/dL (ref 28–170)
Saturation Ratios: 87 % — ABNORMAL HIGH (ref 10.4–31.8)
TIBC: 111 ug/dL — ABNORMAL LOW (ref 250–450)
UIBC: 14 ug/dL

## 2018-07-27 LAB — MRSA PCR SCREENING: MRSA by PCR: NEGATIVE

## 2018-07-27 LAB — C DIFFICILE QUICK SCREEN W PCR REFLEX
C DIFFICILE (CDIFF) TOXIN: NEGATIVE
C Diff antigen: NEGATIVE
C Diff interpretation: NOT DETECTED

## 2018-07-27 LAB — FERRITIN: Ferritin: 1292 ng/mL — ABNORMAL HIGH (ref 11–307)

## 2018-07-27 LAB — FOLATE: Folate: 6 ng/mL (ref >5.9)

## 2018-07-27 LAB — HIV ANTIBODY (ROUTINE TESTING W REFLEX): HIV Screen 4th Generation wRfx: NONREACTIVE

## 2018-07-27 MED ORDER — LOPERAMIDE HCL 2 MG PO CAPS
4.0000 mg | ORAL_CAPSULE | Freq: Once | ORAL | Status: AC
  Administered 2018-07-27: 4 mg via ORAL
  Filled 2018-07-27: qty 2

## 2018-07-27 MED ORDER — FOLIC ACID 1 MG PO TABS
1.0000 mg | ORAL_TABLET | Freq: Every day | ORAL | Status: DC
  Administered 2018-07-27 – 2018-07-30 (×4): 1 mg via ORAL
  Filled 2018-07-27 (×4): qty 1

## 2018-07-27 MED ORDER — ADULT MULTIVITAMIN W/MINERALS CH
1.0000 | ORAL_TABLET | Freq: Every day | ORAL | Status: DC
  Administered 2018-07-27 – 2018-07-30 (×4): 1 via ORAL
  Filled 2018-07-27 (×4): qty 1

## 2018-07-27 MED ORDER — ONDANSETRON HCL 4 MG/2ML IJ SOLN: 4.0000 mg | Freq: Four times a day (QID) | INTRAMUSCULAR | Status: DC | PRN

## 2018-07-27 MED ORDER — NICOTINE 21 MG/24HR TD PT24
21.0000 mg | MEDICATED_PATCH | Freq: Every day | TRANSDERMAL | Status: DC
  Administered 2018-07-27 – 2018-07-30 (×4): 21 mg via TRANSDERMAL
  Filled 2018-07-27 (×4): qty 1

## 2018-07-27 MED ORDER — CALCIUM GLUCONATE-NACL 1-0.675 GM/50ML-% IV SOLN
1.0000 g | Freq: Once | INTRAVENOUS | Status: AC
  Administered 2018-07-27: 1000 mg via INTRAVENOUS
  Filled 2018-07-27: qty 50

## 2018-07-27 MED ORDER — LORAZEPAM 2 MG/ML IJ SOLN: 1.0000 mg | Freq: Four times a day (QID) | INTRAMUSCULAR | Status: AC | PRN

## 2018-07-27 MED ORDER — LOSARTAN POTASSIUM 50 MG PO TABS
50.0000 mg | ORAL_TABLET | Freq: Every day | ORAL | Status: DC
  Administered 2018-07-27: 50 mg via ORAL
  Filled 2018-07-27 (×2): qty 1

## 2018-07-27 MED ORDER — ADULT MULTIVITAMIN W/MINERALS CH: 1.0000 | ORAL_TABLET | Freq: Every day | ORAL | Status: DC

## 2018-07-27 MED ORDER — ONDANSETRON HCL 4 MG PO TABS: 4.0000 mg | ORAL_TABLET | Freq: Four times a day (QID) | ORAL | Status: DC | PRN

## 2018-07-27 MED ORDER — CHLORHEXIDINE GLUCONATE CLOTH 2 % EX PADS
6.0000 | MEDICATED_PAD | Freq: Every day | CUTANEOUS | Status: DC
  Administered 2018-07-29: 6 via TOPICAL

## 2018-07-27 MED ORDER — POTASSIUM CHLORIDE CRYS ER 20 MEQ PO TBCR
40.0000 meq | EXTENDED_RELEASE_TABLET | ORAL | Status: AC
  Administered 2018-07-27 (×3): 40 meq via ORAL
  Filled 2018-07-27 (×3): qty 2

## 2018-07-27 MED ORDER — MAGNESIUM OXIDE 400 (241.3 MG) MG PO TABS
400.0000 mg | ORAL_TABLET | Freq: Two times a day (BID) | ORAL | Status: DC
  Administered 2018-07-27: 400 mg via ORAL
  Filled 2018-07-27: qty 1

## 2018-07-27 MED ORDER — POTASSIUM GLUCONATE 595 (99 K) MG PO TABS
1190.0000 mg | ORAL_TABLET | Freq: Two times a day (BID) | ORAL | Status: DC
  Administered 2018-07-27 (×3): 1190 mg via ORAL
  Filled 2018-07-27 (×5): qty 2

## 2018-07-27 MED ORDER — THIAMINE HCL 100 MG/ML IJ SOLN: 100.0000 mg | Freq: Every day | INTRAMUSCULAR | Status: DC

## 2018-07-27 MED ORDER — POTASSIUM CHLORIDE 10 MEQ/100ML IV SOLN
10.0000 meq | INTRAVENOUS | Status: AC
  Administered 2018-07-27 (×2): 10 meq via INTRAVENOUS
  Filled 2018-07-27 (×2): qty 100

## 2018-07-27 MED ORDER — INFLUENZA VAC SPLIT QUAD 0.5 ML IM SUSY
0.5000 mL | PREFILLED_SYRINGE | INTRAMUSCULAR | Status: AC
  Administered 2018-07-29: 0.5 mL via INTRAMUSCULAR
  Filled 2018-07-27: qty 0.5

## 2018-07-27 MED ORDER — HEPARIN SODIUM (PORCINE) 5000 UNIT/ML IJ SOLN
5000.0000 [IU] | Freq: Three times a day (TID) | INTRAMUSCULAR | Status: DC
  Administered 2018-07-27 – 2018-07-31 (×13): 5000 [IU] via SUBCUTANEOUS
  Filled 2018-07-27 (×13): qty 1

## 2018-07-27 MED ORDER — LORAZEPAM 1 MG PO TABS
1.0000 mg | ORAL_TABLET | Freq: Four times a day (QID) | ORAL | Status: AC | PRN
  Administered 2018-07-27 – 2018-07-29 (×6): 1 mg via ORAL
  Filled 2018-07-27 (×5): qty 1

## 2018-07-27 MED ORDER — MAGNESIUM SULFATE 4 GM/100ML IV SOLN
4.0000 g | Freq: Once | INTRAVENOUS | Status: AC
  Administered 2018-07-27: 4 g via INTRAVENOUS
  Filled 2018-07-27: qty 100

## 2018-07-27 MED ORDER — VITAMIN B-1 100 MG PO TABS
100.0000 mg | ORAL_TABLET | Freq: Every day | ORAL | Status: DC
  Administered 2018-07-27 – 2018-07-30 (×4): 100 mg via ORAL
  Filled 2018-07-27 (×4): qty 1

## 2018-07-27 MED ORDER — KCL IN DEXTROSE-NACL 40-5-0.45 MEQ/L-%-% IV SOLN
INTRAVENOUS | Status: DC
  Administered 2018-07-27 – 2018-07-29 (×7): via INTRAVENOUS
  Filled 2018-07-27 (×7): qty 1000

## 2018-07-27 NOTE — Progress Notes (Signed)
CRITICAL VALUE ALERT  Critical Value:  K <2; Ca 6.1  Date & Time Notied:  07/27/2018 at 10:51   Provider Notified: Caren Griffins, MD  Orders Received/Actions taken: yes/pending

## 2018-07-27 NOTE — Progress Notes (Signed)
CRITICAL VALUE ALERT  Critical Value:  K is 2.0 Cal is 6.1  Date & Time Notied:  07/27/2018 2:42 AM   Provider Notified: NP Bodenheimer   Orders Received/Actions taken: N/A

## 2018-07-27 NOTE — Progress Notes (Signed)
PROGRESS NOTE  Deborah Cabrera:[id number] DOB: November 13, 1960 DOA: 07/26/2018 PCP: Binnie Rail, MD   LOS: 1 day   Brief Narrative / Interim history: 58 year old female with history of hypertension, hyperlipidemia, alcohol abuse drinking brandy liquor every night, anemia of chronic blood loss from uterine fibroids, obesity, who comes to the hospital in the ER with generalized weakness, leg swelling as well as neck pain.  She is also been complaining of profuse diarrhea, on and off for the past 2 weeks but incessant and severe over the last 24 hours.  She was so weak that she was not able to walk anymore.  In the ED she was found to be profoundly hypokalemic with a potassium of less than 2 and admitted to the hospital.   Subjective: -Continues to complain of generalized weakness this morning however feels slightly improved.  Denies any chest pain, denies any shortness of breath.  She denies any abdominal pain, nausea or vomiting.  Has had at least 5 watery stools overnight.  Assessment & Plan: Principal Problem:   Hypokalemia Active Problems:   Hypertension   Tobacco use disorder   Alcohol abuse   Hypocalcemia   Principal Problem Severe hypokalemia and hypomagnesemia with EKG changes -Likely multifactorial in the setting of alcohol abuse as well as intermittent diarrhea over the last 2 weeks.  Continue aggressive potassium supplementation as well as magnesium supplementation, both intravenously and p.o. -Closely monitor BMPs every 6 hours today -Keep on telemetry  Active Problems Diarrhea -Several episodes of watery diarrhea overnight, send a C. Difficile.  Alcoholic hepatitis -Patient with elevated AST and ALT in the 1-300s in a pattern consistent with active alcohol abuse.  Her total bilirubin is elevated at 3.7 and she shows slight alteration of synthetic function with elevated INR at 1.4. -Maddrey's discriminant function is 18, no need for steroids  Alcohol abuse -Continue to  monitor on CIWA, obtain a right upper quadrant ultrasound to evaluate for changes of cirrhosis given decades of drinking  Macrocytic anemia -We will check anemia panel, no evidence of blood loss currently and most likely due to bone marrow suppression in the setting of alcohol abuse  Hypertension -Continue losartan  Hypocalcemia -Received calcium, recheck tomorrow morning  Tobacco abuse -Continue nicotine patch  Lower extremity swelling -Mild, closely monitor while receiving fluids.  Suspect a degree of hypoalbuminemia and third spacing   Scheduled Meds:  Chlorhexidine Gluconate Cloth  6 each Topical X4128   folic acid  1 mg Oral Daily   heparin  5,000 Units Subcutaneous Q8H   losartan  50 mg Oral Daily   multivitamin with minerals  1 tablet Oral Daily   nicotine  21 mg Transdermal Daily   potassium chloride  40 mEq Oral Q2H   potassium gluconate  1,190 mg Oral BID   thiamine  100 mg Oral Daily   Or   thiamine  100 mg Intravenous Daily   Continuous Infusions:  dextrose 5 % and 0.45 % NaCl with KCl 40 mEq/L 125 mL/hr at 07/27/18 0855   magnesium sulfate 1 - 4 g bolus IVPB     PRN Meds:.LORazepam **OR** LORazepam, ondansetron **OR** ondansetron (ZOFRAN) IV  DVT prophylaxis: heparin s.q Code Status: Full code Family Communication: no family at bedside  Disposition Plan: home when ready   Consultants:   None   Procedures:   None   Antimicrobials:  None    Objective: Vitals:   07/27/18 0400 07/27/18 0500 07/27/18 0600 07/27/18 0800  BP: 103/72 Marland Kitchen)  93/58 112/76   Pulse: 84 85 88   Resp:      Temp:    98.1 F (36.7 C)  TempSrc:    Oral  SpO2: 96% 95% 96%     Intake/Output Summary (Last 24 hours) at 07/27/2018 0258 Last data filed at 07/27/2018 0500 Gross per 24 hour  Intake 659.26 ml  Output 300 ml  Net 359.26 ml   There were no vitals filed for this visit.  Examination:  Constitutional: NAD Eyes: PERRL, mild scleral icterus ENMT:  Mucous membranes are moist. No oropharyngeal exudates Neck: normal, supple Respiratory: clear to auscultation bilaterally, no wheezing, no crackles. Normal respiratory effort.  Cardiovascular: Regular rate and rhythm, no murmurs / rubs / gallops.  Trace lower extremity edema, good pulses Abdomen: no tenderness. Bowel sounds positive.  Musculoskeletal: no clubbing / cyanosis.  Skin: no rashes Neurologic: No focal deficits, equal strength Psychiatric: Normal judgment and insight. Alert and oriented x 3. Normal mood.    Data Reviewed: I have independently reviewed following labs and imaging studies   EKG personally reviewed with flattened T waves  CBC: Recent Labs  Lab 07/26/18 1600 07/27/18 0041 07/27/18 0346  WBC 6.1 5.7 5.8  NEUTROABS 4.3  --   --   HGB 11.0* 10.9* 10.1*  HCT 32.7* 33.6* 31.6*  MCV 101.6* 103.7* 103.3*  PLT 203 212 527   Basic Metabolic Panel: Recent Labs  Lab 07/26/18 1600 07/27/18 0041 07/27/18 0346 07/27/18 0719  NA 142 142 142  --   K <2.0* >2.0* <2.0*  --   CL 85* 88* 91*  --   CO2 36* 36* 33*  --   GLUCOSE 106* 114* 160*  --   BUN 10 8 9   --   CREATININE 0.98 0.85 0.93  --   CALCIUM 5.9* 6.1* 6.0*  --   MG  --   --   --  1.1*   GFR: CrCl cannot be calculated (Unknown ideal weight.). Liver Function Tests: Recent Labs  Lab 07/26/18 1600  AST 362*  ALT 126*  ALKPHOS 295*  BILITOT 3.7*  PROT 6.6  ALBUMIN 2.5*   No results for input(s): LIPASE, AMYLASE in the last 168 hours. No results for input(s): AMMONIA in the last 168 hours. Coagulation Profile: Recent Labs  Lab 07/26/18 1600  INR 1.4*   Cardiac Enzymes: No results for input(s): CKTOTAL, CKMB, CKMBINDEX, TROPONINI in the last 168 hours. BNP (last 3 results) No results for input(s): PROBNP in the last 8760 hours. HbA1C: No results for input(s): HGBA1C in the last 72 hours. CBG: No results for input(s): GLUCAP in the last 168 hours. Lipid Profile: No results for input(s):  CHOL, HDL, LDLCALC, TRIG, CHOLHDL, LDLDIRECT in the last 72 hours. Thyroid Function Tests: No results for input(s): TSH, T4TOTAL, FREET4, T3FREE, THYROIDAB in the last 72 hours. Anemia Panel: Recent Labs    07/27/18 0719  RETICCTPCT 1.6   Urine analysis:    Component Value Date/Time   COLORURINE AMBER (A) 07/26/2018 1600   APPEARANCEUR HAZY (A) 07/26/2018 1600   LABSPEC 1.008 07/26/2018 1600   PHURINE 6.0 07/26/2018 1600   GLUCOSEU NEGATIVE 07/26/2018 1600   GLUCOSEU NEGATIVE 06/29/2014 1023   HGBUR LARGE (A) 07/26/2018 1600   BILIRUBINUR NEGATIVE 07/26/2018 1600   BILIRUBINUR neg 12/19/2013 1001   KETONESUR 5 (A) 07/26/2018 1600   PROTEINUR 30 (A) 07/26/2018 1600   UROBILINOGEN 0.2 06/29/2014 1023   NITRITE NEGATIVE 07/26/2018 1600   LEUKOCYTESUR NEGATIVE 07/26/2018 1600  Sepsis Labs: Invalid input(s): PROCALCITONIN, LACTICIDVEN  Recent Results (from the past 240 hour(s))  MRSA PCR Screening     Status: None   Collection Time: 07/26/18 11:55 PM  Result Value Ref Range Status   MRSA by PCR NEGATIVE NEGATIVE Final    Comment:        The GeneXpert MRSA Assay (FDA approved for NASAL specimens only), is one component of a comprehensive MRSA colonization surveillance program. It is not intended to diagnose MRSA infection nor to guide or monitor treatment for MRSA infections. Performed at [facility], [place] [address], [place], [place] [address]       Radiology Studies: Dg Chest 2 View  Result Date: 07/26/2018 CLINICAL DATA:  Chest pain EXAM: CHEST - 2 VIEW COMPARISON:  01/16/2013 FINDINGS: The heart size and mediastinal contours are within normal limits. Both lungs are clear. The visualized skeletal structures are unremarkable. IMPRESSION: No active cardiopulmonary disease. Electronically Signed   By: Inez Catalina M.D.   On: 07/26/2018 16:36   Ct Head Wo Contrast  Result Date: 07/26/2018 CLINICAL DATA:  General weakness and neck pain. Woke up  with neck pain. EXAM: CT HEAD WITHOUT CONTRAST CT CERVICAL SPINE WITHOUT CONTRAST TECHNIQUE: Multidetector CT imaging of the head and cervical spine was performed following the standard protocol without intravenous contrast. Multiplanar CT image reconstructions of the cervical spine were also generated. COMPARISON:  Head CT dated 01/17/2013. FINDINGS: CT HEAD FINDINGS Brain: Ventricles are stable in size and configuration. There is no mass, hemorrhage, edema or other evidence of acute parenchymal abnormality. No extra-axial hemorrhage. Vascular: No hyperdense vessel or unexpected calcification. Skull: Normal. Negative for fracture or focal lesion. Sinuses/Orbits: Visualized paranasal sinuses are clear. Periorbital and retro-orbital soft tissues are unremarkable. Other: None. CT CERVICAL SPINE FINDINGS Alignment: Mild levoscoliosis which may be accentuated by patient positioning. Slight reversal of the normal cervical spine lordosis which could be related to patient positioning or muscle spasm. No evidence of acute vertebral body subluxation. Skull base and vertebrae: No fracture line or displaced fracture fragment seen. No acute or suspicious osseous lesion. Soft tissues and spinal canal: No prevertebral soft tissue swelling or edema. No hemorrhage seen within the central canal. Disc levels: Mild degenerative spondylosis within the mid to lower cervical spine, most prominent at the C5-6 level with associated disc space narrowing and osseous spurring. Associated chronic appearing wedging of the C5 vertebral body. No more than mild central canal stenosis at any level. Upper chest: Negative. Other: Bilateral carotid atherosclerosis, RIGHT greater than LEFT. IMPRESSION: 1. Negative head CT. No intracranial mass, hemorrhage or edema. No skull fracture. 2. No fracture or acute subluxation within the cervical spine. Mild degenerative change within the mid to lower cervical spine, most prominent at the C5-6 level. 3.  Carotid atherosclerosis, right greater than left. Electronically Signed   By: Franki Cabot M.D.   On: 07/26/2018 16:50   Ct Cervical Spine Wo Contrast  Result Date: 07/26/2018 CLINICAL DATA:  General weakness and neck pain. Woke up with neck pain. EXAM: CT HEAD WITHOUT CONTRAST CT CERVICAL SPINE WITHOUT CONTRAST TECHNIQUE: Multidetector CT imaging of the head and cervical spine was performed following the standard protocol without intravenous contrast. Multiplanar CT image reconstructions of the cervical spine were also generated. COMPARISON:  Head CT dated 01/17/2013. FINDINGS: CT HEAD FINDINGS Brain: Ventricles are stable in size and configuration. There is no mass, hemorrhage, edema or other evidence of acute parenchymal abnormality. No extra-axial hemorrhage. Vascular: No hyperdense vessel or  unexpected calcification. Skull: Normal. Negative for fracture or focal lesion. Sinuses/Orbits: Visualized paranasal sinuses are clear. Periorbital and retro-orbital soft tissues are unremarkable. Other: None. CT CERVICAL SPINE FINDINGS Alignment: Mild levoscoliosis which may be accentuated by patient positioning. Slight reversal of the normal cervical spine lordosis which could be related to patient positioning or muscle spasm. No evidence of acute vertebral body subluxation. Skull base and vertebrae: No fracture line or displaced fracture fragment seen. No acute or suspicious osseous lesion. Soft tissues and spinal canal: No prevertebral soft tissue swelling or edema. No hemorrhage seen within the central canal. Disc levels: Mild degenerative spondylosis within the mid to lower cervical spine, most prominent at the C5-6 level with associated disc space narrowing and osseous spurring. Associated chronic appearing wedging of the C5 vertebral body. No more than mild central canal stenosis at any level. Upper chest: Negative. Other: Bilateral carotid atherosclerosis, RIGHT greater than LEFT. IMPRESSION: 1. Negative head  CT. No intracranial mass, hemorrhage or edema. No skull fracture. 2. No fracture or acute subluxation within the cervical spine. Mild degenerative change within the mid to lower cervical spine, most prominent at the C5-6 level. 3. Carotid atherosclerosis, right greater than left. Electronically Signed   By: Franki Cabot M.D.   On: 07/26/2018 16:50   US Abdomen Limited Ruq  Result Date: 07/27/2018 CLINICAL DATA:  Elevated LFTs.  ETOH. EXAM: ULTRASOUND ABDOMEN LIMITED RIGHT UPPER QUADRANT COMPARISON:  None. FINDINGS: Gallbladder: No gallstones or wall thickening visualized. No sonographic Murphy sign noted by sonographer. Common bile duct: Diameter: 5.2 mm. Liver: Moderate diffuse increased parenchymal echogenicity without focal mass. Portal vein is patent on color Doppler imaging with normal direction of blood flow towards the liver. IMPRESSION: No acute hepatobiliary findings. Moderate hepatic steatosis without focal mass. Electronically Signed   By: Marin Olp M.D.   On: 07/27/2018 07:45    Marzetta Board, MD, PhD [facility]  Contact via  [email or web address]  [place] P: [phone]  F: [phone]

## 2018-07-27 NOTE — Progress Notes (Signed)
Paged Bodenheimar NP twice about the Pt's K of 2.0 and Cal 6.1, no orders given at this time. The Pt  had five stools though out my shift made his aware, no orders given at this time. I will continue to monitor.

## 2018-07-28 ENCOUNTER — Inpatient Hospital Stay (HOSPITAL_COMMUNITY): Payer: Self-pay

## 2018-07-28 LAB — BASIC METABOLIC PANEL
Anion gap: 13 (ref 5–15)
Anion gap: 14 (ref 5–15)
Anion gap: 15 (ref 5–15)
BUN: 6 mg/dL (ref 6–20)
BUN: 5 mg/dL — ABNORMAL LOW (ref 6–20)
BUN: 5 mg/dL — AB (ref 6–20)
CALCIUM: 6.5 mg/dL — AB (ref 8.9–10.3)
CALCIUM: 6.8 mg/dL — AB (ref 8.9–10.3)
CO2: 29 mmol/L (ref 22–32)
CO2: 28 mmol/L (ref 22–32)
CO2: 27 mmol/L (ref 22–32)
CREATININE: 0.71 mg/dL (ref 0.44–1.00)
Calcium: 6.9 mg/dL — ABNORMAL LOW (ref 8.9–10.3)
Chloride: 99 mmol/L (ref 98–111)
Chloride: 100 mmol/L (ref 98–111)
Chloride: 101 mmol/L (ref 98–111)
Creatinine, Ser: 0.76 mg/dL (ref 0.44–1.00)
Creatinine, Ser: 0.71 mg/dL (ref 0.44–1.00)
GFR calc Af Amer: >60 mL/min (ref >60)
GFR calc Af Amer: >60 mL/min (ref >60)
GFR calc Af Amer: >60 mL/min (ref >60)
GFR calc non Af Amer: >60 mL/min (ref >60)
GFR calc non Af Amer: >60 mL/min (ref >60)
GFR calc non Af Amer: >60 mL/min (ref >60)
Glucose, Bld: 93 mg/dL (ref 70–99)
Glucose, Bld: 95 mg/dL (ref 70–99)
Glucose, Bld: 86 mg/dL (ref 70–99)
Potassium: 3 mmol/L — ABNORMAL LOW (ref 3.5–5.1)
Potassium: 2.6 mmol/L — CL (ref 3.5–5.1)
Potassium: 3.1 mmol/L — ABNORMAL LOW (ref 3.5–5.1)
Sodium: 141 mmol/L (ref 135–145)
Sodium: 142 mmol/L (ref 135–145)
Sodium: 143 mmol/L (ref 135–145)

## 2018-07-28 LAB — CBC
HCT: 31.7 % — ABNORMAL LOW (ref 36.0–46.0)
Hemoglobin: 10.2 g/dL — ABNORMAL LOW (ref 12.0–15.0)
MCH: 33.2 pg (ref 26.0–34.0)
MCHC: 32.2 g/dL (ref 30.0–36.0)
MCV: 103.3 fL — ABNORMAL HIGH (ref 80.0–100.0)
Platelets: 176 10*3/uL (ref 150–400)
RBC: 3.07 MIL/uL — ABNORMAL LOW (ref 3.87–5.11)
RDW: 18.9 % — ABNORMAL HIGH (ref 11.5–15.5)
WBC: 6 10*3/uL (ref 4.0–10.5)
nRBC: 1.5 % — ABNORMAL HIGH (ref 0.0–0.2)

## 2018-07-28 LAB — HEPATIC FUNCTION PANEL
ALT: 118 U/L — ABNORMAL HIGH (ref 0–44)
AST: <5 U/L — ABNORMAL LOW (ref 15–41)
Albumin: 2.3 g/dL — ABNORMAL LOW (ref 3.5–5.0)
Alkaline Phosphatase: 271 U/L — ABNORMAL HIGH (ref 38–126)
BILIRUBIN INDIRECT: 1.3 mg/dL — AB (ref 0.3–0.9)
Bilirubin, Direct: 1.4 mg/dL — ABNORMAL HIGH (ref 0.0–0.2)
Total Bilirubin: 2.7 mg/dL — ABNORMAL HIGH (ref 0.3–1.2)
Total Protein: 5.9 g/dL — ABNORMAL LOW (ref 6.5–8.1)

## 2018-07-28 LAB — URINE CULTURE: Culture: NO GROWTH

## 2018-07-28 LAB — PROTIME-INR
INR: 1.4 — AB (ref 0.8–1.2)
PROTHROMBIN TIME: 16.7 s — AB (ref 11.4–15.2)

## 2018-07-28 LAB — MAGNESIUM: Magnesium: 1.9 mg/dL (ref 1.7–2.4)

## 2018-07-28 MED ORDER — TRAMADOL HCL 50 MG PO TABS
50.0000 mg | ORAL_TABLET | Freq: Four times a day (QID) | ORAL | Status: DC | PRN
  Administered 2018-07-28 – 2018-07-30 (×2): 50 mg via ORAL
  Filled 2018-07-28 (×2): qty 1

## 2018-07-28 MED ORDER — POTASSIUM CHLORIDE CRYS ER 20 MEQ PO TBCR
40.0000 meq | EXTENDED_RELEASE_TABLET | ORAL | Status: AC
  Administered 2018-07-28 (×2): 40 meq via ORAL
  Filled 2018-07-28: qty 2

## 2018-07-28 MED ORDER — LOPERAMIDE HCL 2 MG PO CAPS
2.0000 mg | ORAL_CAPSULE | ORAL | Status: DC | PRN
  Administered 2018-07-28 (×3): 2 mg via ORAL
  Filled 2018-07-28 (×3): qty 1

## 2018-07-28 MED ORDER — POTASSIUM CHLORIDE 10 MEQ/100ML IV SOLN
10.0000 meq | INTRAVENOUS | Status: AC
  Administered 2018-07-28 (×2): 10 meq via INTRAVENOUS
  Filled 2018-07-28 (×2): qty 100

## 2018-07-28 MED ORDER — TRAMADOL HCL 50 MG PO TABS: 50.0000 mg | ORAL_TABLET | Freq: Four times a day (QID) | ORAL | Status: DC | PRN

## 2018-07-28 MED ORDER — POTASSIUM CHLORIDE CRYS ER 20 MEQ PO TBCR
40.0000 meq | EXTENDED_RELEASE_TABLET | ORAL | Status: AC
  Administered 2018-07-28 (×2): 40 meq via ORAL
  Filled 2018-07-28 (×2): qty 2

## 2018-07-28 MED ORDER — CALCIUM GLUCONATE-NACL 1-0.675 GM/50ML-% IV SOLN
1.0000 g | Freq: Once | INTRAVENOUS | Status: AC
  Administered 2018-07-28: 1000 mg via INTRAVENOUS
  Filled 2018-07-28: qty 50

## 2018-07-28 MED ORDER — MAGNESIUM SULFATE 2 GM/50ML IV SOLN
2.0000 g | Freq: Once | INTRAVENOUS | Status: AC
  Administered 2018-07-28: 2 g via INTRAVENOUS
  Filled 2018-07-28: qty 50

## 2018-07-28 NOTE — Progress Notes (Signed)
PROGRESS NOTE  Deborah Cabrera OYD:[id number] DOB: Dec 30, 1960 DOA: 07/26/2018 PCP: Binnie Rail, MD   LOS: 2 days   Brief Narrative / Interim history: 58 year old female with history of hypertension, hyperlipidemia, alcohol abuse drinking brandy liquor every night, anemia of chronic blood loss from uterine fibroids, obesity, who comes to the hospital in the ER with generalized weakness, leg swelling as well as neck pain.  She is also been complaining of profuse diarrhea, on and off for the past 2 weeks but incessant and severe over the last 24 hours.  She was so weak that she was not able to walk anymore.  In the ED she was found to be profoundly hypokalemic with a potassium of less than 2 and admitted to the hospital.   Subjective: -Weakness seem to be slowly improving, she denies any chest pain, no shortness of breath.  Has no abdominal pain, nausea or vomiting.  Diarrhea gradually appears to be improving.   Assessment & Plan: Principal Problem:   Hypokalemia Active Problems:   Hypertension   Tobacco use disorder   Alcohol abuse   Hypocalcemia   Principal Problem Severe hypokalemia and hypomagnesemia with EKG changes -Likely multifactorial in the setting of alcohol abuse as well as intermittent diarrhea over the last 2 weeks.  Continue aggressive potassium supplementation as well as magnesium supplementation, both intravenously and p.o. -Potassium this morning finally around 3.0, continue supplementation with a goal of 4.0 -Magnesium also improved at 1.9, will stay ahead and transfuse 2 more grams  Active Problems Diarrhea -On and off for the last couple weeks, C. difficile was negative.  Imodium as needed.  Monitor, blood pressure slightly on the low side, will obtain a plain abdominal x-ray today  Alcoholic hepatitis -Patient with elevated AST and ALT in the 1-300s in a pattern consistent with active alcohol abuse.  Her total bilirubin is elevated at 3.7 and she shows slight  alteration of synthetic function with elevated INR at 1.4. -Maddrey's discriminant function is 18, no need for steroids -LFTs improving this morning  Alcohol abuse -Continue to monitor on CIWA, right upper quadrant ultrasound obtained on 3/22 showed fatty liver disease  Macrocytic anemia -Anemia panel with borderline folate level and normal vitamin B12.  Continue folate  Hypertension -Discontinue losartan this morning in the setting of hypotension overnight  Hypocalcemia -Continue to replace calcium today  Tobacco abuse -Continue nicotine patch  Lower extremity swelling -Mild, closely monitor while receiving fluids.  Suspect a degree of hypoalbuminemia and third spacing   Scheduled Meds:  Chlorhexidine Gluconate Cloth  6 each Topical E7209   folic acid  1 mg Oral Daily   heparin  5,000 Units Subcutaneous Q8H   Influenza vac split quadrivalent PF  0.5 mL Intramuscular Tomorrow-1000   losartan  50 mg Oral Daily   multivitamin with minerals  1 tablet Oral Daily   nicotine  21 mg Transdermal Daily   potassium chloride  40 mEq Oral Q2H   thiamine  100 mg Oral Daily   Or   thiamine  100 mg Intravenous Daily   Continuous Infusions:  dextrose 5 % and 0.45 % NaCl with KCl 40 mEq/L 125 mL/hr at 07/28/18 0100   magnesium sulfate 1 - 4 g bolus IVPB     PRN Meds:.loperamide, LORazepam **OR** LORazepam, ondansetron **OR** ondansetron (ZOFRAN) IV  DVT prophylaxis: heparin s.q Code Status: Full code Family Communication: no family at bedside  Disposition Plan: home when ready   Consultants:   None  Procedures:   None   Antimicrobials:  None    Objective: Vitals:   07/28/18 0600 07/28/18 0700 07/28/18 0800 07/28/18 0825  BP: (!) 89/68 (!) 77/39  100/65  Pulse: 92 97  97  Resp: (!) 23 (!) 27  18  Temp:   98.2 F (36.8 C)   TempSrc:   Oral   SpO2: 100% 93%  99%  Weight:      Height:        Intake/Output Summary (Last 24 hours) at 07/28/2018 0851 Last  data filed at 07/28/2018 5784 Gross per 24 hour  Intake 4344.81 ml  Output 700 ml  Net 3644.81 ml   Filed Weights   07/27/18 1703  Weight: 81.5 kg    Examination:  Constitutional: No distress Eyes: No significant scleral icterus appreciated ENMT: mmm Neck: normal, supple Respiratory: Clear to auscultation bilaterally, no wheezing or crackles heard.  Normal respiratory effort Cardiovascular: Regular rate and rhythm, no murmurs appreciated.  1+ pitting lower extremity edema Abdomen: Soft, nontender, nondistended, positive bowel sounds Musculoskeletal: no clubbing / cyanosis.  Skin: No rashes seen Neurologic: Nonfocal, equal strength Psychiatric: Alert x oriented X3   Data Reviewed: I have independently reviewed following labs and imaging studies   CBC: Recent Labs  Lab 07/26/18 1600 07/27/18 0041 07/27/18 0346 07/28/18 0148  WBC 6.1 5.7 5.8 6.0  NEUTROABS 4.3  --   --   --   HGB 11.0* 10.9* 10.1* 10.2*  HCT 32.7* 33.6* 31.6* 31.7*  MCV 101.6* 103.7* 103.3* 103.3*  PLT 203 212 197 696   Basic Metabolic Panel: Recent Labs  Lab 07/27/18 0719 07/27/18 0924 07/27/18 1354 07/27/18 2015 07/28/18 0148 07/28/18 0818  NA  --  141 142 140 141 142  K  --  <2.0* <2.0* 2.9* 3.0* 2.6*  CL  --  90* 91* 95* 99 100  CO2  --  33* 33* 29 29 28   GLUCOSE  --  128* 142* 126* 93 95  BUN  --  8 7 7 6  5*  CREATININE  --  0.80 0.92 0.88 0.76 0.71  CALCIUM  --  6.1* 6.5* 6.2* 6.5* 6.9*  MG 1.1*  --   --   --  1.9  --    GFR: Estimated Creatinine Clearance: 87.9 mL/min (by C-G formula based on SCr of 0.71 mg/dL). Liver Function Tests: Recent Labs  Lab 07/26/18 1600 07/28/18 0148  AST 362* <5*  ALT 126* 118*  ALKPHOS 295* 271*  BILITOT 3.7* 2.7*  PROT 6.6 5.9*  ALBUMIN 2.5* 2.3*   No results for input(s): LIPASE, AMYLASE in the last 168 hours. No results for input(s): AMMONIA in the last 168 hours. Coagulation Profile: Recent Labs  Lab 07/26/18 1600 07/28/18 0148  INR  1.4* 1.4*   Cardiac Enzymes: No results for input(s): CKTOTAL, CKMB, CKMBINDEX, TROPONINI in the last 168 hours. BNP (last 3 results) No results for input(s): PROBNP in the last 8760 hours. HbA1C: No results for input(s): HGBA1C in the last 72 hours. CBG: No results for input(s): GLUCAP in the last 168 hours. Lipid Profile: No results for input(s): CHOL, HDL, LDLCALC, TRIG, CHOLHDL, LDLDIRECT in the last 72 hours. Thyroid Function Tests: No results for input(s): TSH, T4TOTAL, FREET4, T3FREE, THYROIDAB in the last 72 hours. Anemia Panel: Recent Labs    07/27/18 0719  VITAMINB12 1,047*  FOLATE 6.0  FERRITIN 1,292*  TIBC 111*  IRON 97  RETICCTPCT 1.6   Urine analysis:    Component Value Date/Time  COLORURINE AMBER (A) 07/26/2018 1600   APPEARANCEUR HAZY (A) 07/26/2018 1600   LABSPEC 1.008 07/26/2018 1600   PHURINE 6.0 07/26/2018 1600   GLUCOSEU NEGATIVE 07/26/2018 1600   GLUCOSEU NEGATIVE 06/29/2014 1023   HGBUR LARGE (A) 07/26/2018 1600   BILIRUBINUR NEGATIVE 07/26/2018 1600   BILIRUBINUR neg 12/19/2013 1001   KETONESUR 5 (A) 07/26/2018 1600   PROTEINUR 30 (A) 07/26/2018 1600   UROBILINOGEN 0.2 06/29/2014 1023   NITRITE NEGATIVE 07/26/2018 1600   LEUKOCYTESUR NEGATIVE 07/26/2018 1600   Sepsis Labs: Invalid input(s): PROCALCITONIN, LACTICIDVEN  Recent Results (from the past 240 hour(s))  MRSA PCR Screening     Status: None   Collection Time: 07/26/18 11:55 PM  Result Value Ref Range Status   MRSA by PCR NEGATIVE NEGATIVE Final    Comment:        The GeneXpert MRSA Assay (FDA approved for NASAL specimens only), is one component of a comprehensive MRSA colonization surveillance program. It is not intended to diagnose MRSA infection nor to guide or monitor treatment for MRSA infections. Performed at [facility], [place] [address], [place], [place] [address]   C difficile quick scan w PCR reflex     Status: None   Collection Time: 07/27/18   6:47 AM  Result Value Ref Range Status   C Diff antigen NEGATIVE NEGATIVE Final   C Diff toxin NEGATIVE NEGATIVE Final   C Diff interpretation No C. difficile detected.  Final    Comment: Performed at [facility], [place] [address], [place],  [address]      Radiology Studies: Dg Chest 2 View  Result Date: 07/26/2018 CLINICAL DATA:  Chest pain EXAM: CHEST - 2 VIEW COMPARISON:  01/16/2013 FINDINGS: The heart size and mediastinal contours are within normal limits. Both lungs are clear. The visualized skeletal structures are unremarkable. IMPRESSION: No active cardiopulmonary disease. Electronically Signed   By: Inez Catalina M.D.   On: 07/26/2018 16:36   Ct Head Wo Contrast  Result Date: 07/26/2018 CLINICAL DATA:  General weakness and neck pain. Woke up with neck pain. EXAM: CT HEAD WITHOUT CONTRAST CT CERVICAL SPINE WITHOUT CONTRAST TECHNIQUE: Multidetector CT imaging of the head and cervical spine was performed following the standard protocol without intravenous contrast. Multiplanar CT image reconstructions of the cervical spine were also generated. COMPARISON:  Head CT dated 01/17/2013. FINDINGS: CT HEAD FINDINGS Brain: Ventricles are stable in size and configuration. There is no mass, hemorrhage, edema or other evidence of acute parenchymal abnormality. No extra-axial hemorrhage. Vascular: No hyperdense vessel or unexpected calcification. Skull: Normal. Negative for fracture or focal lesion. Sinuses/Orbits: Visualized paranasal sinuses are clear. Periorbital and retro-orbital soft tissues are unremarkable. Other: None. CT CERVICAL SPINE FINDINGS Alignment: Mild levoscoliosis which may be accentuated by patient positioning. Slight reversal of the normal cervical spine lordosis which could be related to patient positioning or muscle spasm. No evidence of acute vertebral body subluxation. Skull base and vertebrae: No fracture line or displaced fracture fragment seen. No acute or  suspicious osseous lesion. Soft tissues and spinal canal: No prevertebral soft tissue swelling or edema. No hemorrhage seen within the central canal. Disc levels: Mild degenerative spondylosis within the mid to lower cervical spine, most prominent at the C5-6 level with associated disc space narrowing and osseous spurring. Associated chronic appearing wedging of the C5 vertebral body. No more than mild central canal stenosis at any level. Upper chest: Negative. Other: Bilateral carotid atherosclerosis, RIGHT greater than LEFT. IMPRESSION: 1. Negative  head CT. No intracranial mass, hemorrhage or edema. No skull fracture. 2. No fracture or acute subluxation within the cervical spine. Mild degenerative change within the mid to lower cervical spine, most prominent at the C5-6 level. 3. Carotid atherosclerosis, right greater than left. Electronically Signed   By: Franki Cabot M.D.   On: 07/26/2018 16:50   Ct Cervical Spine Wo Contrast  Result Date: 07/26/2018 CLINICAL DATA:  General weakness and neck pain. Woke up with neck pain. EXAM: CT HEAD WITHOUT CONTRAST CT CERVICAL SPINE WITHOUT CONTRAST TECHNIQUE: Multidetector CT imaging of the head and cervical spine was performed following the standard protocol without intravenous contrast. Multiplanar CT image reconstructions of the cervical spine were also generated. COMPARISON:  Head CT dated 01/17/2013. FINDINGS: CT HEAD FINDINGS Brain: Ventricles are stable in size and configuration. There is no mass, hemorrhage, edema or other evidence of acute parenchymal abnormality. No extra-axial hemorrhage. Vascular: No hyperdense vessel or unexpected calcification. Skull: Normal. Negative for fracture or focal lesion. Sinuses/Orbits: Visualized paranasal sinuses are clear. Periorbital and retro-orbital soft tissues are unremarkable. Other: None. CT CERVICAL SPINE FINDINGS Alignment: Mild levoscoliosis which may be accentuated by patient positioning. Slight reversal of the  normal cervical spine lordosis which could be related to patient positioning or muscle spasm. No evidence of acute vertebral body subluxation. Skull base and vertebrae: No fracture line or displaced fracture fragment seen. No acute or suspicious osseous lesion. Soft tissues and spinal canal: No prevertebral soft tissue swelling or edema. No hemorrhage seen within the central canal. Disc levels: Mild degenerative spondylosis within the mid to lower cervical spine, most prominent at the C5-6 level with associated disc space narrowing and osseous spurring. Associated chronic appearing wedging of the C5 vertebral body. No more than mild central canal stenosis at any level. Upper chest: Negative. Other: Bilateral carotid atherosclerosis, RIGHT greater than LEFT. IMPRESSION: 1. Negative head CT. No intracranial mass, hemorrhage or edema. No skull fracture. 2. No fracture or acute subluxation within the cervical spine. Mild degenerative change within the mid to lower cervical spine, most prominent at the C5-6 level. 3. Carotid atherosclerosis, right greater than left. Electronically Signed   By: Franki Cabot M.D.   On: 07/26/2018 16:50   US Abdomen Limited Ruq  Result Date: 07/27/2018 CLINICAL DATA:  Elevated LFTs.  ETOH. EXAM: ULTRASOUND ABDOMEN LIMITED RIGHT UPPER QUADRANT COMPARISON:  None. FINDINGS: Gallbladder: No gallstones or wall thickening visualized. No sonographic Murphy sign noted by sonographer. Common bile duct: Diameter: 5.2 mm. Liver: Moderate diffuse increased parenchymal echogenicity without focal mass. Portal vein is patent on color Doppler imaging with normal direction of blood flow towards the liver. IMPRESSION: No acute hepatobiliary findings. Moderate hepatic steatosis without focal mass. Electronically Signed   By: Marin Olp M.D.   On: 07/27/2018 07:45    Marzetta Board, MD, PhD [facility]  Contact via  [email or web address]  [place] P: [phone]  F:  [phone]

## 2018-07-28 NOTE — Evaluation (Signed)
Physical Therapy Evaluation Patient Details Name: Deborah Cabrera MRN: [id number] DOB: Jan 01, 1961 Today's Date: 07/28/2018   History of Present Illness  58 year old female with history of hypertension, hyperlipidemia, alcohol abuse drinking brandy liquor every night, anemia of chronic blood loss from uterine fibroids, obesity, who comes to the hospital in the ER with generalized weakness, leg swelling as well as neck pain.  She is also been complaining of profuse diarrhea, on and off for the past 2 weeks.  She was so weak that she was not able to walk anymore. Dx of hypokalemia.   Clinical Impression  Pt admitted with above diagnosis. Pt currently with functional limitations due to the deficits listed below (see PT Problem List). Max assist for sit to stand from recliner. Pt stood briefly with RW and performed marching in standing x 3 repetitions BLEs, then needed to sit 2* BLE fatigue/cramping. Pt will benefit from skilled PT to increase their independence and safety with mobility to allow discharge to the venue listed below.       Follow Up Recommendations SNF vs. HHPT depending on progress    Equipment Recommendations  Rolling walker with 5" wheels    Recommendations for Other Services       Precautions / Restrictions Precautions Precautions: None Precaution Comments: denies h/o falls in past 1 year Restrictions Weight Bearing Restrictions: No      Mobility  Bed Mobility               General bed mobility comments: up in recliner  Transfers Overall transfer level: Needs assistance Equipment used: Rolling walker (2 wheeled) Transfers: Sit to/from Stand Sit to Stand: Max assist         General transfer comment: max A to rise (pt 50%) from recliner, VCs hand placement; pt marched in place with RW x 3 BLEs, tolerance limited by LE fatigue/cramping  Ambulation/Gait             General Gait Details: deferred 2* poor standing tolerance  Stairs             Wheelchair Mobility    Modified Rankin (Stroke Patients Only)       Balance Overall balance assessment: Needs assistance   Sitting balance-Leahy Scale: Fair     Standing balance support: Bilateral upper extremity supported Standing balance-Leahy Scale: Poor Standing balance comment: relies on BUE support                             Pertinent Vitals/Pain Pain Assessment: 0-10 Pain Score: 7  Pain Location: L ankle with weightbearing Pain Descriptors / Indicators: Aching Pain Intervention(s): Limited activity within patient's tolerance;Monitored during session;Repositioned    Home Living Family/patient expects to be discharged to:: Private residence Living Arrangements: Parent;Other (Comment)(granddaughter) Available Help at Discharge: Family   Home Access: Level entry     Home Layout: One level Home Equipment: None      Prior Function Level of Independence: Independent               Hand Dominance        Extremity/Trunk Assessment   Upper Extremity Assessment Upper Extremity Assessment: Generalized weakness(-3/5 shoulder flexion B)    Lower Extremity Assessment Lower Extremity Assessment: Generalized weakness(B knee extension -4/5)    Cervical / Trunk Assessment Cervical / Trunk Assessment: Normal  Communication   Communication: No difficulties  Cognition Arousal/Alertness: Awake/alert Behavior During Therapy: WFL for tasks assessed/performed Overall Cognitive Status: Within Functional  Limits for tasks assessed                                        General Comments      Exercises General Exercises - Lower Extremity Ankle Circles/Pumps: AROM;Both;10 reps;Supine Short Arc Quad: AROM;Both;5 reps;Supine   Assessment/Plan    PT Assessment Patient needs continued PT services  PT Problem List Decreased strength;Decreased activity tolerance;Decreased mobility;Decreased balance;Decreased knowledge of use of DME        PT Treatment Interventions Gait training;Functional mobility training;Therapeutic exercise;Patient/family education;Therapeutic activities    PT Goals (Current goals can be found in the Care Plan section)  Acute Rehab PT Goals Patient Stated Goal: return home, care for her mother (cooking/cleaning) PT Goal Formulation: With patient Time For Goal Achievement: 08/11/18 Potential to Achieve Goals: Good    Frequency Min 3X/week   Barriers to discharge        Co-evaluation               AM-PAC PT "6 Clicks" Mobility  Outcome Measure Help needed turning from your back to your side while in a flat bed without using bedrails?: A Little Help needed moving from lying on your back to sitting on the side of a flat bed without using bedrails?: A Lot Help needed moving to and from a bed to a chair (including a wheelchair)?: A Lot Help needed standing up from a chair using your arms (e.g., wheelchair or bedside chair)?: A Lot Help needed to walk in hospital room?: Total Help needed climbing 3-5 steps with a railing? : Total 6 Click Score: 11    End of Session Equipment Utilized During Treatment: Gait belt Activity Tolerance: Patient limited by fatigue Patient left: in chair;with call bell/phone within reach Nurse Communication: Mobility status PT Visit Diagnosis: Difficulty in walking, not elsewhere classified (R26.2);Pain;Muscle weakness (generalized) (M62.81) Pain - Right/Left: Left Pain - part of body: Ankle and joints of foot    Time: 3614-4315 PT Time Calculation (min) (ACUTE ONLY): 18 min   Charges:   PT Evaluation $PT Eval Moderate Complexity: 1 Mod         Philomena Doheny PT 07/28/2018  Acute Rehabilitation Services Pager [phone] Office [phone]

## 2018-07-29 LAB — BASIC METABOLIC PANEL
Anion gap: 9 (ref 5–15)
BUN: 6 mg/dL (ref 6–20)
CO2: 26 mmol/L (ref 22–32)
Calcium: 6.7 mg/dL — ABNORMAL LOW (ref 8.9–10.3)
Chloride: 105 mmol/L (ref 98–111)
Creatinine, Ser: 0.72 mg/dL (ref 0.44–1.00)
GFR calc Af Amer: >60 mL/min (ref >60)
Glucose, Bld: 101 mg/dL — ABNORMAL HIGH (ref 70–99)
Potassium: 5.5 mmol/L — ABNORMAL HIGH (ref 3.5–5.1)
Sodium: 140 mmol/L (ref 135–145)

## 2018-07-29 LAB — CBC
HCT: 30.3 % — ABNORMAL LOW (ref 36.0–46.0)
Hemoglobin: 9.8 g/dL — ABNORMAL LOW (ref 12.0–15.0)
MCH: 33.9 pg (ref 26.0–34.0)
MCHC: 32.3 g/dL (ref 30.0–36.0)
MCV: 104.8 fL — ABNORMAL HIGH (ref 80.0–100.0)
PLATELETS: 195 10*3/uL (ref 150–400)
RBC: 2.89 MIL/uL — ABNORMAL LOW (ref 3.87–5.11)
RDW: 19.7 % — ABNORMAL HIGH (ref 11.5–15.5)
WBC: 6.6 10*3/uL (ref 4.0–10.5)
nRBC: 2 % — ABNORMAL HIGH (ref 0.0–0.2)

## 2018-07-29 MED ORDER — CHLORHEXIDINE GLUCONATE CLOTH 2 % EX PADS
6.0000 | MEDICATED_PAD | Freq: Every day | CUTANEOUS | Status: DC
  Administered 2018-07-29: 6 via TOPICAL

## 2018-07-29 MED ORDER — DEXTROSE-NACL 5-0.45 % IV SOLN
INTRAVENOUS | Status: DC
  Administered 2018-07-29 (×2): via INTRAVENOUS

## 2018-07-29 NOTE — Progress Notes (Signed)
Pt potassium 5.5 after morning labs were drawn. RN stopped pt maintenance fluids which were D5 1/2 NS with 69mEq of K going at 169mL/hr, and paged Tylene Fantasia to make her aware. Will continue to monitor

## 2018-07-29 NOTE — Progress Notes (Signed)
Patient transferred to 1511. Patient alert and oriented x4. VSS. Patient oriented to room and call bell is in reach. Assessment unchanged from previously documented.

## 2018-07-29 NOTE — Progress Notes (Signed)
PT Cancellation Note  Patient Details Name: ROBI MITTER MRN: [id number] DOB: [birth year]   Cancelled Treatment:    Reason Eval/Treat Not Completed: Fatigue/lethargy limiting ability to participate(attempted to see pt 3 times today. One time she was soiled, then she was eating lunch, this afternoon she was too fatigued from getting up to use commode. Will follow. )  Philomena Doheny PT 07/29/2018  Acute Rehabilitation Services Pager [phone] Office [phone]

## 2018-07-29 NOTE — Progress Notes (Signed)
PROGRESS NOTE  Deborah Cabrera ZOX:[id number] DOB: 08-13-1960 DOA: 07/26/2018 PCP: Binnie Rail, MD   LOS: 3 days   Brief Narrative / Interim history: 58 year old female with history of hypertension, hyperlipidemia, alcohol abuse drinking brandy liquor every night, anemia of chronic blood loss from uterine fibroids, obesity, who comes to the hospital in the ER with generalized weakness, leg swelling as well as neck pain.  She is also been complaining of profuse diarrhea, on and off for the past 2 weeks but incessant and severe over the last 24 hours.  She was so weak that she was not able to walk anymore.  In the ED she was found to be profoundly hypokalemic with a potassium of less than 2 and admitted to the hospital.  She was initially placed in stepdown with aggressive potassium supplementation, her potassium has normalized and was transferred to telemetry on 3/24.  She has also started to withdraw a little bit more on hospital day 2-3  Subjective: -No significant complaints this morning, feels a little bit stronger, denies any chest pain or shortness of breath.  Bedside RN reports that overnight she seems to be more tremulous and starting to withdraw.  Assessment & Plan: Principal Problem:   Hypokalemia Active Problems:   Hypertension   Tobacco use disorder   Alcohol abuse   Hypocalcemia   Principal Problem Severe hypokalemia and hypomagnesemia with EKG changes -Likely multifactorial in the setting of alcohol abuse as well as intermittent diarrhea over the last 2 weeks.  Patient received aggressive potassium supplementation and K this morning is in fact 5.5, discontinue potassium containing fluids -Magnesium yesterday 1.9 -Stable for the floor, moved to telemetry.  Physical therapy to reevaluate  Active Problems Diarrhea -On and off for the last couple weeks, C. difficile was negative.  Imodium as needed. -This seems to be resolving, patient only had 1 bowel movement in the last  24 hours.  Continue to monitor  Alcoholic hepatitis -Patient with elevated AST and ALT in the 1-300s in a pattern consistent with active alcohol abuse.  Her total bilirubin is elevated at 3.7 and she shows slight alteration of synthetic function with elevated INR at 1.4. -Maddrey's discriminant function is 18, no need for steroids -LFTs are improving  Alcohol abuse -Continue to monitor on CIWA, right upper quadrant ultrasound obtained on 3/22 showed fatty liver disease -Started withdrawal little bit more 3/23-3/24, continue to closely monitor.  Macrocytic anemia -Anemia panel with borderline folate level and normal vitamin B12.  Continue folate  Hypertension with episodes of hypotension -Patient's blood pressure runs low when she is in bed and asleep, however on waking up her blood pressure is in the 811 systolic or more.  She is asymptomatic with this.  Her losartan has been held.  Hypocalcemia -Calcium this morning 6.7, but corrected for albumin is 8.1.  Continue to monitor.  She has received calcium x2  Tobacco abuse -Continue nicotine patch  Lower extremity swelling -Mild, closely monitor while receiving fluids.  Suspect a degree of hypoalbuminemia and third spacing -Stable today, decrease the rate of fluids as her diarrhea is resolving   Scheduled Meds: . Chlorhexidine Gluconate Cloth  6 each Topical Daily  . folic acid  1 mg Oral Daily  . heparin  5,000 Units Subcutaneous Q8H  . Influenza vac split quadrivalent PF  0.5 mL Intramuscular Tomorrow-1000  . multivitamin with minerals  1 tablet Oral Daily  . nicotine  21 mg Transdermal Daily  . thiamine  100 mg  Oral Daily   Or  . thiamine  100 mg Intravenous Daily   Continuous Infusions: . dextrose 5 % and 0.45% NaCl 100 mL/hr at 07/29/18 0421   PRN Meds:.loperamide, LORazepam **OR** LORazepam, ondansetron **OR** ondansetron (ZOFRAN) IV, traMADol  DVT prophylaxis: heparin s.q Code Status: Full code Family Communication:  no family at bedside  Disposition Plan: home when ready   Consultants:   None   Procedures:   None   Antimicrobials:  None    Objective: Vitals:   07/29/18 0330 07/29/18 0349 07/29/18 0400 07/29/18 0500  BP: (!) 89/58  105/77 98/65  Pulse: (!) 102  99 93  Resp: (!) 28  (!) 25 20  Temp:  97.6 F (36.4 C)    TempSrc:  Oral    SpO2: 97%  99% 97%  Weight:   89.6 kg   Height:        Intake/Output Summary (Last 24 hours) at 07/29/2018 0814 Last data filed at 07/29/2018 0700 Gross per 24 hour  Intake 3360.46 ml  Output 750 ml  Net 2610.46 ml   Filed Weights   07/27/18 1703 07/29/18 0400  Weight: 81.5 kg 89.6 kg    Examination:  Constitutional: No distress, a bit tremulous this morning Eyes: No significant scleral icterus appreciated ENMT: Moist mucous membranes Neck: normal, supple Respiratory: Lungs clear to auscultation bilaterally, no wheezing or crackles heard.  Decreased breath sounds at the bases.  Normal respiratory effort Cardiovascular: Regular rate and rhythm, no murmurs appreciated.  1+ lower extremity edema Abdomen: Soft, nontender, nondistended, positive bowel sounds Musculoskeletal: no clubbing / cyanosis.  Skin: No rashes seen Neurologic: No focal deficits, strength 5 out of 5 in all 4 extremities Psychiatric: Alert x oriented X3   Data Reviewed: I have independently reviewed following labs and imaging studies   CBC: Recent Labs  Lab 07/26/18 1600 07/27/18 0041 07/27/18 0346 07/28/18 0148 07/29/18 0319  WBC 6.1 5.7 5.8 6.0 6.6  NEUTROABS 4.3  --   --   --   --   HGB 11.0* 10.9* 10.1* 10.2* 9.8*  HCT 32.7* 33.6* 31.6* 31.7* 30.3*  MCV 101.6* 103.7* 103.3* 103.3* 104.8*  PLT 203 212 197 176 762   Basic Metabolic Panel: Recent Labs  Lab 07/27/18 0719  07/27/18 2015 07/28/18 0148 07/28/18 0818 07/28/18 1536 07/29/18 0319  NA  --    < > 140 141 142 143 140  K  --    < > 2.9* 3.0* 2.6* 3.1* 5.5*  CL  --    < > 95* 99 100 101 105   CO2  --    < > 29 29 28 27 26   GLUCOSE  --    < > 126* 93 95 86 101*  BUN  --    < > 7 6 5* 5* 6  CREATININE  --    < > 0.88 0.76 0.71 0.71 0.72  CALCIUM  --    < > 6.2* 6.5* 6.9* 6.8* 6.7*  MG 1.1*  --   --  1.9  --   --   --    < > = values in this interval not displayed.   GFR: Estimated Creatinine Clearance: 92 mL/min (by C-G formula based on SCr of 0.72 mg/dL). Liver Function Tests: Recent Labs  Lab 07/26/18 1600 07/28/18 0148  AST 362* <5*  ALT 126* 118*  ALKPHOS 295* 271*  BILITOT 3.7* 2.7*  PROT 6.6 5.9*  ALBUMIN 2.5* 2.3*   No results for input(s):  LIPASE, AMYLASE in the last 168 hours. No results for input(s): AMMONIA in the last 168 hours. Coagulation Profile: Recent Labs  Lab 07/26/18 1600 07/28/18 0148  INR 1.4* 1.4*   Cardiac Enzymes: No results for input(s): CKTOTAL, CKMB, CKMBINDEX, TROPONINI in the last 168 hours. BNP (last 3 results) No results for input(s): PROBNP in the last 8760 hours. HbA1C: No results for input(s): HGBA1C in the last 72 hours. CBG: No results for input(s): GLUCAP in the last 168 hours. Lipid Profile: No results for input(s): CHOL, HDL, LDLCALC, TRIG, CHOLHDL, LDLDIRECT in the last 72 hours. Thyroid Function Tests: No results for input(s): TSH, T4TOTAL, FREET4, T3FREE, THYROIDAB in the last 72 hours. Anemia Panel: Recent Labs    07/27/18 0719  VITAMINB12 1,047*  FOLATE 6.0  FERRITIN 1,292*  TIBC 111*  IRON 97  RETICCTPCT 1.6   Urine analysis:    Component Value Date/Time   COLORURINE AMBER (A) 07/26/2018 1600   APPEARANCEUR HAZY (A) 07/26/2018 1600   LABSPEC 1.008 07/26/2018 1600   PHURINE 6.0 07/26/2018 1600   GLUCOSEU NEGATIVE 07/26/2018 1600   GLUCOSEU NEGATIVE 06/29/2014 1023   HGBUR LARGE (A) 07/26/2018 1600   BILIRUBINUR NEGATIVE 07/26/2018 1600   BILIRUBINUR neg 12/19/2013 1001   KETONESUR 5 (A) 07/26/2018 1600   PROTEINUR 30 (A) 07/26/2018 1600   UROBILINOGEN 0.2 06/29/2014 1023   NITRITE NEGATIVE  07/26/2018 1600   LEUKOCYTESUR NEGATIVE 07/26/2018 1600   Sepsis Labs: Invalid input(s): PROCALCITONIN, LACTICIDVEN  Recent Results (from the past 240 hour(s))  Urine culture     Status: None   Collection Time: 07/26/18  4:00 PM  Result Value Ref Range Status   Specimen Description   Final    URINE, CLEAN CATCH Performed at [facility], [place] [address], [place], [place] [address]    Special Requests   Final    NONE Performed at [facility], [place] [address], [place], [place] [address]    Culture   Final    NO GROWTH Performed at [facility], [place] [address], [place], [place] [address]    Report Status 07/28/2018 FINAL  Final  MRSA PCR Screening     Status: None   Collection Time: 07/26/18 11:55 PM  Result Value Ref Range Status   MRSA by PCR NEGATIVE NEGATIVE Final    Comment:        The GeneXpert MRSA Assay (FDA approved for NASAL specimens only), is one component of a comprehensive MRSA colonization surveillance program. It is not intended to diagnose MRSA infection nor to guide or monitor treatment for MRSA infections. Performed at [facility], [place] [address], [place], [place] [address]   C difficile quick scan w PCR reflex     Status: None   Collection Time: 07/27/18  6:47 AM  Result Value Ref Range Status   C Diff antigen NEGATIVE NEGATIVE Final   C Diff toxin NEGATIVE NEGATIVE Final   C Diff interpretation No C. difficile detected.  Final    Comment: Performed at [facility], [place] [address], [place],  [address]      Radiology Studies: Dg Abd 2 Views  Result Date: 07/28/2018 CLINICAL DATA:  Abdominal pain and diarrhea EXAM: ABDOMEN - 2 VIEW COMPARISON:  None. FINDINGS: Scattered large and small bowel gas is noted. No obstructive changes are seen. No free air is noted. Mild scoliosis of the lumbar spine is noted concave to the right compensatory from findings seen on  recent chest x-ray. IMPRESSION: No acute abnormality noted. Electronically Signed   By: Inez Catalina M.D.   On: 07/28/2018 14:52    Marzetta Board, MD, PhD [facility]  Contact via  [email or web address]  [place] P: [phone]  F: [phone]

## 2018-07-30 LAB — BASIC METABOLIC PANEL
Anion gap: 9 (ref 5–15)
BUN: 12 mg/dL (ref 6–20)
CHLORIDE: 105 mmol/L (ref 98–111)
CO2: 25 mmol/L (ref 22–32)
Calcium: 7.3 mg/dL — ABNORMAL LOW (ref 8.9–10.3)
Creatinine, Ser: 0.8 mg/dL (ref 0.44–1.00)
GFR calc Af Amer: >60 mL/min (ref >60)
GFR calc non Af Amer: >60 mL/min (ref >60)
Glucose, Bld: 77 mg/dL (ref 70–99)
Potassium: 5.3 mmol/L — ABNORMAL HIGH (ref 3.5–5.1)
Sodium: 139 mmol/L (ref 135–145)

## 2018-07-30 LAB — CBC
HCT: 35.2 % — ABNORMAL LOW (ref 36.0–46.0)
Hemoglobin: 11.2 g/dL — ABNORMAL LOW (ref 12.0–15.0)
MCH: 33.6 pg (ref 26.0–34.0)
MCHC: 31.8 g/dL (ref 30.0–36.0)
MCV: 105.7 fL — ABNORMAL HIGH (ref 80.0–100.0)
Platelets: 197 10*3/uL (ref 150–400)
RBC: 3.33 MIL/uL — ABNORMAL LOW (ref 3.87–5.11)
RDW: 20.2 % — ABNORMAL HIGH (ref 11.5–15.5)
WBC: 6.3 10*3/uL (ref 4.0–10.5)
nRBC: 0.6 % — ABNORMAL HIGH (ref 0.0–0.2)

## 2018-07-30 MED ORDER — HYDROCORTISONE ACETATE 25 MG RE SUPP
25.0000 mg | Freq: Two times a day (BID) | RECTAL | Status: DC
  Administered 2018-07-30 (×2): 25 mg via RECTAL
  Filled 2018-07-30 (×3): qty 1

## 2018-07-30 NOTE — Progress Notes (Signed)
PROGRESS NOTE  Deborah Cabrera ATF:[id number] DOB: 1960/12/17 DOA: 07/26/2018 PCP: Binnie Rail, MD   LOS: 4 days   Brief narrative: Patient is a 58 year old female with history of hypertension, hyperlipidemia, alcohol abuse drinking brandy liquor every night, anemia of chronic blood loss from uterine fibroids, obesity, who comes to the hospital in the ER with generalized weakness, leg swelling as well as neck pain.  She is also been complaining of profuse diarrhea, on and off for the past 2 weeks but incessant and severe over the last 24 hours.  She was so weak that she was not able to walk anymore.  In the ED she was found to be profoundly hypokalemic with a potassium of less than 2 and admitted to the hospital.  She was initially placed in stepdown with aggressive potassium supplementation, her potassium has normalized and was transferred to telemetry on 3/24.  She has also started to withdraw a little bit more on hospital day 2-3  Subjective: Patient was seen and examined this morning.  Middle-aged African-American female.  Not in withdrawal.  Feels weak.  No nausea or vomiting or tremors.  Has 1+ bilateral pedal edema  Assessment/Plan:  Principal Problem:   Hypokalemia Active Problems:   Hypertension   Tobacco use disorder   Alcohol abuse   Hypocalcemia  Severe hypokalemia and hypomagnesemia with EKG changes -Likely multifactorial in the setting of alcohol abuse as well as intermittent diarrhea over the last 2 weeks.  Patient received aggressive potassium supplementation. Potassium level this morning is up at 5.3 which is slightly better than 5.5 yesterday.  Potassium supplementation have been stopped.  -Magnesium 1.9  Diarrhea -On and off for the last couple weeks, C. difficile was negative.  Imodium as needed. -This seems to be resolving, patient only had 1 bowel movement since yesterday morning.  - Continue to monitor  Alcoholic hepatitis -Patient with elevated AST and ALT in  the 1-300s in a pattern consistent with active alcohol abuse.  Her total bilirubin is elevated at 3.7 and she shows slight alteration of synthetic function with elevated INR at 1.4. -Maddrey's discriminant function is 18, no need for steroids -LFTs are improving now.  Alcohol abuse - right upper quadrant ultrasound obtained on 3/22 showed fatty liver disease. -Patient started having tremors in the hospital.  Controlled with Ativan IV.  No evidence of withdrawal symptoms.  Macrocytic anemia -Anemia panel with borderline folate level and normal vitamin B12.  Continue folate  Hypertension with episodes of hypotension -Patient's blood pressure runs low when she is in bed and asleep, however on waking up her blood pressure is in the 270 systolic or more.  She is asymptomatic with this.  Her losartan has been held.  Hypocalcemia -Calcium this morning 6.7, but corrected for albumin is 8.1.  Continue to monitor.  She has received calcium x2  Tobacco abuse -Continue nicotine patch  Lower extremity swelling - 1+ bilateral. Suspect a degree of hypoalbuminemia and third spacing - Stop IV fluid.  Obtain ultrasound duplex.  Body mass index is 30.03 kg/m. Mobility: PT eval obtained.  Home health PT versus SNF recommended. Diet: Regular diet DVT prophylaxis:  Heparin subcu Code Status:   Code Status: Full Code  Family Communication:  Family not at bedside Disposition Plan:  Home with home PT versus SNF in 1 to 2 days.  Consultants:  None  Procedures:  None  Antimicrobials:  Anti-infectives (From admission, onward)   None      Infusions: Stop IV  fluids.   Scheduled Meds: . folic acid  1 mg Oral Daily  . heparin  5,000 Units Subcutaneous Q8H  . hydrocortisone  25 mg Rectal BID  . multivitamin with minerals  1 tablet Oral Daily  . nicotine  21 mg Transdermal Daily  . thiamine  100 mg Oral Daily   Or  . thiamine  100 mg Intravenous Daily    PRN meds: loperamide,  ondansetron **OR** ondansetron (ZOFRAN) IV, traMADol   Objective: Vitals:   07/30/18 0000 07/30/18 0447  BP:  111/81  Pulse: 94 91  Resp:  20  Temp:  98.1 F (36.7 C)  SpO2:  100%    Intake/Output Summary (Last 24 hours) at 07/30/2018 1203 Last data filed at 07/30/2018 0551 Gross per 24 hour  Intake 1080 ml  Output -  Net 1080 ml   Filed Weights   07/27/18 1703 07/29/18 0400  Weight: 81.5 kg 89.6 kg   Weight change:  Body mass index is 30.03 kg/m.   Physical Exam: General exam: Appears calm and comfortable.  Skin: No rashes, lesions or ulcers. HEENT: Normal Lungs: Clear to auscultation bilaterally CVS: Regular rate and rhythm, no murmur GI/Abd soft, nontender, nondistended, bowel sound present CNS: Alert, awake, oriented x3 Psychiatry: Mood & affect appropriate.  Extremities: 1+ bilateral pedal edema  Data Review: I have personally reviewed the laboratory data and studies available.  Recent Labs  Lab 07/26/18 1600 07/27/18 0041 07/27/18 0346 07/28/18 0148 07/29/18 0319 07/30/18 0531  WBC 6.1 5.7 5.8 6.0 6.6 6.3  NEUTROABS 4.3  --   --   --   --   --   HGB 11.0* 10.9* 10.1* 10.2* 9.8* 11.2*  HCT 32.7* 33.6* 31.6* 31.7* 30.3* 35.2*  MCV 101.6* 103.7* 103.3* 103.3* 104.8* 105.7*  PLT 203 212 197 176 195 197   Recent Labs  Lab 07/27/18 0719  07/28/18 0148 07/28/18 0818 07/28/18 1536 07/29/18 0319 07/30/18 0531  NA  --    < > 141 142 143 140 139  K  --    < > 3.0* 2.6* 3.1* 5.5* 5.3*  CL  --    < > 99 100 101 105 105  CO2  --    < > 29 28 27 26 25   GLUCOSE  --    < > 93 95 86 101* 77  BUN  --    < > 6 5* 5* 6 12  CREATININE  --    < > 0.76 0.71 0.71 0.72 0.80  CALCIUM  --    < > 6.5* 6.9* 6.8* 6.7* 7.3*  MG 1.1*  --  1.9  --   --   --   --    < > = values in this interval not displayed.   Recent Labs  Lab 07/26/18 1600 07/28/18 0148  AST 362* <5*  ALT 126* 118*  ALKPHOS 295* 271*  BILITOT 3.7* 2.7*  PROT 6.6 5.9*  ALBUMIN 2.5* 2.3*     Terrilee Croak, MD  [facility] 07/30/2018

## 2018-07-30 NOTE — Progress Notes (Signed)
Physical Therapy Treatment Patient Details Name: Deborah Cabrera MRN: [id number] DOB: March 25, 1961 Today's Date: 07/30/2018    History of Present Illness 58 year old female admitted with hypokalemia, diarrhea, weakness, inability to ambulate. History of hypertension, hyperlipidemia, alcohol abuse, anemia, obesity.      PT Comments    Progressing slowly with mobility. Pt continues to require Min-Mod assist for mobility. Will continue to follow.    Follow Up Recommendations  SNF(HHPT if SNF not an option)     Equipment Recommendations  Rolling walker with 5" wheels    Recommendations for Other Services       Precautions / Restrictions Precautions Precautions: Fall Restrictions Weight Bearing Restrictions: No    Mobility  Bed Mobility Overal bed mobility: Needs Assistance Bed Mobility: Supine to Sit;Sit to Supine     Supine to sit: Min assist;HOB elevated Sit to supine: Mod assist   General bed mobility comments: Assist for scoot to EOB and LEs. Increased time. Pt relied on bedrail   Transfers Overall transfer level: Needs assistance Equipment used: Rolling walker (2 wheeled) Transfers: Sit to/from Stand Sit to Stand: Mod assist         General transfer comment: x2. Assist to rise, stabilize, control descent. VCs safety, technique, hand placement.   Ambulation/Gait Ambulation/Gait assistance: Min assist Gait Distance (Feet): 25 Feet Assistive device: Rolling walker (2 wheeled) Gait Pattern/deviations: Step-through pattern;Trunk flexed;Decreased stride length     General Gait Details: Assist to stabilize pt throughout distance. Slow, effortful gait speed. Pt fatigues easily.    Stairs             Wheelchair Mobility    Modified Rankin (Stroke Patients Only)       Balance Overall balance assessment: Needs assistance         Standing balance support: Bilateral upper extremity supported Standing balance-Leahy Scale: Poor                               Cognition Arousal/Alertness: Awake/alert Behavior During Therapy: WFL for tasks assessed/performed Overall Cognitive Status: Within Functional Limits for tasks assessed                                        Exercises General Exercises - Lower Extremity Ankle Circles/Pumps: AROM;Both;10 reps;Supine Quad Sets: AROM;Both;10 reps;Supine    General Comments        Pertinent Vitals/Pain Pain Assessment: Faces Faces Pain Scale: Hurts even more Pain Location: bil LEs Pain Descriptors / Indicators: Aching Pain Intervention(s): Limited activity within patient's tolerance;Repositioned    Home Living                      Prior Function            PT Goals (current goals can now be found in the care plan section) Progress towards PT goals: Progressing toward goals    Frequency    Min 3X/week      PT Plan Current plan remains appropriate    Co-evaluation              AM-PAC PT "6 Clicks" Mobility   Outcome Measure  Help needed turning from your back to your side while in a flat bed without using bedrails?: A Little Help needed moving from lying on your back to sitting on the side of a flat bed  without using bedrails?: A Little Help needed moving to and from a bed to a chair (including a wheelchair)?: A Lot Help needed standing up from a chair using your arms (e.g., wheelchair or bedside chair)?: A Lot Help needed to walk in hospital room?: A Little Help needed climbing 3-5 steps with a railing? : Total 6 Click Score: 14    End of Session Equipment Utilized During Treatment: Gait belt Activity Tolerance: Patient limited by fatigue Patient left: in bed;with call bell/phone within reach;with bed alarm set   PT Visit Diagnosis: Difficulty in walking, not elsewhere classified (R26.2);Pain;Muscle weakness (generalized) (M62.81) Pain - Right/Left: Left(bil) Pain - part of body: Leg     Time: 1002-1020 PT Time  Calculation (min) (ACUTE ONLY): 18 min  Charges:  $Gait Training: 8-22 mins                       Weston Anna, [place] Pager: [phone] Office: [phone]

## 2018-07-30 NOTE — TOC Initial Note (Signed)
Transition of Care [facility]) - Initial/Assessment Note    Patient Details  Name: Deborah Cabrera MRN: [id number] Date of Birth: 1961-04-24  Transition of Care [facility]) CM/SW Contact:    Servando Snare, LCSW Phone Number: 07/30/2018, 10:37 AM  Clinical Narrative: Patient is from home where she cares for her mother. Patient quit her job to care for her mother. Patient does not have a PCP or insurance at this time. Patient is interested in going home with home health. Patient has support from her children.                    Expected Discharge Plan: [place] Barriers to Discharge: Inadequate or no insurance, Continued Medical Work up   Patient Goals and CMS Choice Patient states their goals for this hospitalization and ongoing recovery are:: Return home      Expected Discharge Plan and Services Expected Discharge Plan: [place]       Living arrangements for the past 2 months: Single Family Home Expected Discharge Date: 07/29/18                        Prior Living Arrangements/Services Living arrangements for the past 2 months: Single Family Home Lives with:: Parents, Other (Comment)(granddaughter) Patient language and need for interpreter reviewed:: No Do you feel safe going back to the place where you live?: Yes      Need for Family Participation in Patient Care: Yes (Comment) Care giver support system in place?: Yes (comment)   Criminal Activity/Legal Involvement Pertinent to Current Situation/Hospitalization: No - Comment as needed  Activities of Daily Living Home Assistive Devices/Equipment: None ADL Screening (condition at time of admission) Patient's cognitive ability adequate to safely complete daily activities?: Yes Is the patient deaf or have difficulty hearing?: Yes Does the patient have difficulty seeing, even when wearing glasses/contacts?: No Does the patient have difficulty concentrating, remembering, or making decisions?:  No Patient able to express need for assistance with ADLs?: Yes Does the patient have difficulty dressing or bathing?: No Independently performs ADLs?: Yes (appropriate for developmental age) Does the patient have difficulty walking or climbing stairs?: No Weakness of Legs: Both Weakness of Arms/Hands: Both  Permission Sought/Granted Permission sought to share information with : Other (comment) Permission granted to share information with : Yes, Verbal Permission Granted     Permission granted to share info w AGENCY: Home health agencies        Emotional Assessment Appearance:: Appears stated age Attitude/Demeanor/Rapport: Engaged Affect (typically observed): Accepting, Calm Orientation: : Oriented to Self, Oriented to Place, Oriented to  Time, Oriented to Situation Alcohol / Substance Use: Alcohol Use Psych Involvement: No (comment)  Admission diagnosis:  Hypocalcemia [E83.51] Hepatomegaly [R16.0] Hypokalemia [T70.0] Alcoholic liver disease (HCC) [K70.9] Peripheral edema [R60.9] Weakness [R53.1] Osteoarthritis of cervical spine, unspecified spinal osteoarthritis complication status [F74.944] Patient Active Problem List   Diagnosis Date Noted  . Hypokalemia 07/26/2018  . Alcohol abuse 07/26/2018  . Hypocalcemia 07/26/2018  . Pain in both wrists 10/07/2017  . Muscle cramping 07/17/2016  . Family history of diabetes mellitus 07/17/2016  . Mitral regurgitation, trivial 01/17/2016  . Diastolic dysfunction without heart failure 01/17/2016  . Overweight 06/29/2014  . Transient synovitis of right knee 02/18/2014  . Hypertension   . Tobacco use disorder   . Arthritis of knee 01/31/2013  . Uterine fibroids 01/19/2013   PCP:  Binnie Rail, MD Pharmacy:   [facility] [place], [place] [address] [place] [address] Phone: [phone] Fax: [phone]  [place], [place]  [facility] [address] [place] [address] [place] [address] Phone: [phone] Fax: [phone]     Social Determinants of Health ([place]) Interventions    Readmission Risk Interventions Readmission Risk Prevention Plan 07/30/2018  Post Dischage Appt Complete  Medication Screening Complete  Transportation Screening Complete  Some recent data might be hidden

## 2018-07-31 ENCOUNTER — Inpatient Hospital Stay (HOSPITAL_COMMUNITY): Payer: Self-pay

## 2018-07-31 DIAGNOSIS — R609 Edema, unspecified: Secondary | ICD-10-CM

## 2018-07-31 LAB — COMPREHENSIVE METABOLIC PANEL
ALT: 122 U/L — ABNORMAL HIGH (ref 0–44)
AST: 349 U/L — ABNORMAL HIGH (ref 15–41)
Albumin: 2.1 g/dL — ABNORMAL LOW (ref 3.5–5.0)
Alkaline Phosphatase: 311 U/L — ABNORMAL HIGH (ref 38–126)
Anion gap: 8 (ref 5–15)
BUN: 15 mg/dL (ref 6–20)
CO2: 23 mmol/L (ref 22–32)
Calcium: 7.4 mg/dL — ABNORMAL LOW (ref 8.9–10.3)
Chloride: 106 mmol/L (ref 98–111)
Creatinine, Ser: 0.89 mg/dL (ref 0.44–1.00)
GFR calc Af Amer: >60 mL/min (ref >60)
GFR calc non Af Amer: >60 mL/min (ref >60)
Glucose, Bld: 81 mg/dL (ref 70–99)
Potassium: 4.1 mmol/L (ref 3.5–5.1)
Sodium: 137 mmol/L (ref 135–145)
TOTAL PROTEIN: 5.9 g/dL — AB (ref 6.5–8.1)
Total Bilirubin: 2.5 mg/dL — ABNORMAL HIGH (ref 0.3–1.2)

## 2018-07-31 MED ORDER — HYDROCORTISONE ACETATE 25 MG RE SUPP: 25.0000 mg | Freq: Two times a day (BID) | RECTAL | 0 refills | Status: AC

## 2018-07-31 MED ORDER — THIAMINE HCL 100 MG PO TABS: 100.0000 mg | ORAL_TABLET | Freq: Every day | ORAL | 0 refills | Status: AC

## 2018-07-31 MED ORDER — FOLIC ACID 1 MG PO TABS: 1.0000 mg | ORAL_TABLET | Freq: Every day | ORAL | 0 refills | Status: AC

## 2018-07-31 MED ORDER — LOPERAMIDE HCL 2 MG PO CAPS: 2.0000 mg | ORAL_CAPSULE | ORAL | 0 refills | Status: AC | PRN

## 2018-07-31 NOTE — Progress Notes (Signed)
Physical Therapy Treatment Patient Details Name: Deborah Cabrera MRN: [id number] DOB: 1960-07-24 Today's Date: 07/31/2018    History of Present Illness 58 year old female admitted with hypokalemia, diarrhea, weakness, inability to ambulate. History of hypertension, hyperlipidemia, alcohol abuse, anemia, obesity.      PT Comments    Progressing slowly with mobility. Pt c/o pain in bil legs and feet. Noted edema in bil LEs. Will continue to follow.    Follow Up Recommendations  SNF(HHPT if SNF is not an option)     Equipment Recommendations  Rolling walker with 5" wheels;3in1 (PT)    Recommendations for Other Services       Precautions / Restrictions Precautions Precautions: Fall Restrictions Weight Bearing Restrictions: No    Mobility  Bed Mobility Overal bed mobility: Needs Assistance Bed Mobility: Supine to Sit;Rolling Rolling: Modified independent (Device/Increase time)   Supine to sit: Modified independent (Device/Increase time);HOB elevated Sit to Supine: Mod Assist      General bed mobility comments:  Increased time. Pt relied on bedrail. Assist for LEs onto bed.  Transfers Overall transfer level: Needs assistance Equipment used: Rolling walker (2 wheeled) Transfers: Sit to/from Stand Sit to Stand: Mod assist;From elevated surface         General transfer comment: x2. Assist to rise, stabilize, control descent. VCs safety, technique, hand placement.   Ambulation/Gait Ambulation/Gait assistance: Min assist Gait Distance (Feet): 45 Feet Assistive device: Rolling walker (2 wheeled) Gait Pattern/deviations: Step-through pattern;Antalgic     General Gait Details: Assist to stabilize pt throughout distance. Slow, effortful gait speed. Pt fatigues easily.    Stairs             Wheelchair Mobility    Modified Rankin (Stroke Patients Only)       Balance Overall balance assessment: Needs assistance         Standing balance support: Bilateral  upper extremity supported Standing balance-Leahy Scale: Poor                              Cognition Arousal/Alertness: Awake/alert Behavior During Therapy: WFL for tasks assessed/performed Overall Cognitive Status: Within Functional Limits for tasks assessed                                        Exercises      General Comments        Pertinent Vitals/Pain Pain Assessment: Faces Faces Pain Scale: Hurts even more Pain Location: bil legs and feet Pain Descriptors / Indicators: Numbness;Aching Pain Intervention(s): Monitored during session;Limited activity within patient's tolerance;Repositioned    Home Living                      Prior Function            PT Goals (current goals can now be found in the care plan section) Progress towards PT goals: Progressing toward goals    Frequency    Min 3X/week      PT Plan Current plan remains appropriate    Co-evaluation              AM-PAC PT "6 Clicks" Mobility   Outcome Measure  Help needed turning from your back to your side while in a flat bed without using bedrails?: A Little Help needed moving from lying on your back to sitting on the side of  a flat bed without using bedrails?: A Little Help needed moving to and from a bed to a chair (including a wheelchair)?: A Little Help needed standing up from a chair using your arms (e.g., wheelchair or bedside chair)?: A Lot Help needed to walk in hospital room?: A Little Help needed climbing 3-5 steps with a railing? : Total 6 Click Score: 15    End of Session Equipment Utilized During Treatment: Gait belt Activity Tolerance: Patient limited by fatigue;Patient limited by pain Patient left: in bed;with call bell/phone within reach;with bed alarm set   PT Visit Diagnosis: Difficulty in walking, not elsewhere classified (R26.2);Pain Pain - Right/Left: (bil) Pain - part of body: Ankle and joints of foot;Leg     Time:  2229-7989 PT Time Calculation (min) (ACUTE ONLY): 12 min  Charges:  $Gait Training: 8-22 mins                        Weston Anna, PT Acute Rehabilitation Services Pager: [phone] Office: [phone]

## 2018-07-31 NOTE — Discharge Summary (Signed)
Physician Discharge Summary  Deborah Cabrera:[id number] DOB: [birth year] DOA: 07/26/2018  PCP: Binnie Rail, MD  Admit date: 07/26/2018 Discharge date: 07/31/2018  Admitted From: Home Discharge disposition: home   Code Status: Full Code   Recommendations for Outpatient Follow-Up:   1. Continue magnesium and potassium at home.  Discharge Diagnosis:   Active Problems:   Hypertension   Tobacco use disorder   Alcohol abuse  Alcoholic hepatitis  History of Present Illness / Brief narrative:  Patient is a 58 year old female with history of hypertension, hyperlipidemia, alcohol abuse drinking brandy liquor every night, anemia of chronic blood loss from uterine fibroids, obesity, who comes to the hospital in the ER with generalized weakness, leg swelling as well as neck pain. She is also been complaining of profuse diarrhea, on and off for the past 2 weeks but incessant and severe over the last 24 hours. She was so weak that she was not able to walk anymore.  In the ED she was found to be profoundly hypokalemic with a potassium of less than 2 and admitted to the hospital. She was initially placed in stepdown with aggressive potassium supplementation, her potassium has normalized and was transferred to telemetry on 3/24. She also started to have alcohol withdrawal symptoms which have now improved.    Hospital Course:  Severe hypokalemia and hypomagnesemia with EKG changes -Likely multifactorial in the setting of alcohol abuse as well as intermittent diarrhea over the last 2 weeks.Patient received aggressive potassium supplementation. Magnesium 1.9  Diarrhea -On and off for the last couple weeks, C. difficile was negative. Imodium as needed. -This seems to be resolving, patient only had 1 bowel movement since yesterday morning.  Alcoholic hepatitis -Patient with elevated AST and ALT in the 1-300s in a pattern consistent with active alcohol abuse. Her total bilirubin is  elevated at 3.7 and she shows slight alteration of synthetic function with elevated INR at 1.4. -Maddrey's discriminant function is 18, no need for steroids -LFTs are improving now. -Follow-up with GI as an outpatient  Alcohol abuse - right upper quadrant ultrasound obtained on 3/22 showed fatty liver disease. -Patient started having tremors in the hospital.  Controlled with Ativan IV.  No evidence of withdrawal symptoms today.  Macrocytic anemia -Anemia panel with borderline folate level and normal vitamin B12. Continue folate  Hypertensionwith episodes of hypotension -Patient's blood pressure runs low when she is in bed and asleep, however on waking up her blood pressure is in the 956 systolic or more. She is asymptomatic with this. Her losartan has been held.  Resume post discharge.  Hypocalcemia -Calcium this morning 6.7, but corrected for albumin is 8.1. Continue to monitor. She has received calcium x2  Tobacco abuse -Continue nicotine patch  Lower extremity swelling - 1+ bilateral.Suspect a degree of hypoalbuminemia and third spacing - Stopped IV fluid.    Ultrasound duplex negative for DVT.  Medical Consultants:    None   Subjective:  Patient was seen and examined this morning.  Middle-aged African-American female.  Not in distress.  She walked with physical therapy yesterday.  Eager to go home today.  Discharge Exam:   Vitals:   07/30/18 1410 07/30/18 2042 07/30/18 2108 07/31/18 0534  BP: 108/83 104/75  102/75  Pulse: 99 (!) 108 96 99  Resp: 18 (!) 26 18 (!) 21  Temp: 98.3 F (36.8 C) 98.1 F (36.7 C)  98.2 F (36.8 C)  TempSrc: Oral Oral  Oral  SpO2: 100% 100%  96%  Weight:      Height:        Body mass index is 30.03 kg/m.  General exam: Appears calm and comfortable.  Not in distress Skin: No rashes, lesions or ulcers. HEENT: Normal Lungs: Clear to auscultate bilaterally CVS: Regular rate and rhythm, no murmur GI/Abd soft, nontender,  nondistended, bowel sound present CNS: Alert, awake and oriented x3 Psychiatry: Mood & affect appropriate.  Extremities: Trace bilateral pedal edema  Discharge Instructions:  Wound care: None Discharge Instructions    Diet - low sodium heart healthy   Complete by:  As directed    Increase activity slowly   Complete by:  As directed       Allergies as of 07/31/2018   No Known Allergies     Medication List    TAKE these medications   folic acid 1 MG tablet Commonly known as:  FOLVITE Take 1 tablet (1 mg total) by mouth daily for 30 days.   hydrocortisone 25 MG suppository Commonly known as:  ANUSOL-HC Place 1 suppository (25 mg total) rectally 2 (two) times daily for 6 days.   loperamide 2 MG capsule Commonly known as:  IMODIUM Take 1 capsule (2 mg total) by mouth as needed for up to 14 days for diarrhea or loose stools.   losartan 50 MG tablet Commonly known as:  COZAAR Take 1 tablet (50 mg total) by mouth daily.   Magnesium 250 MG Tabs Take 2 tablets by mouth 2 (two) times daily.   multivitamin with minerals Tabs tablet Take 1 tablet by mouth daily.   Omega-3 1000 MG Caps Taking daily   Potassium Gluconate 550 MG Tabs Take 2 tablets by mouth 2 (two) times daily.   thiamine 100 MG tablet Take 1 tablet (100 mg total) by mouth daily for 30 days.       Time coordinating discharge: 35 minutes  The results of significant diagnostics from this hospitalization (including imaging, microbiology, ancillary and laboratory) are listed below for reference.    Procedures and Diagnostic Studies:   Dg Chest 2 View  Result Date: 07/26/2018 CLINICAL DATA:  Chest pain EXAM: CHEST - 2 VIEW COMPARISON:  01/16/2013 FINDINGS: The heart size and mediastinal contours are within normal limits. Both lungs are clear. The visualized skeletal structures are unremarkable. IMPRESSION: No active cardiopulmonary disease. Electronically Signed   By: Inez Catalina M.D.   On: 07/26/2018 16:36    Ct Head Wo Contrast  Result Date: 07/26/2018 CLINICAL DATA:  General weakness and neck pain. Woke up with neck pain. EXAM: CT HEAD WITHOUT CONTRAST CT CERVICAL SPINE WITHOUT CONTRAST TECHNIQUE: Multidetector CT imaging of the head and cervical spine was performed following the standard protocol without intravenous contrast. Multiplanar CT image reconstructions of the cervical spine were also generated. COMPARISON:  Head CT dated 01/17/2013. FINDINGS: CT HEAD FINDINGS Brain: Ventricles are stable in size and configuration. There is no mass, hemorrhage, edema or other evidence of acute parenchymal abnormality. No extra-axial hemorrhage. Vascular: No hyperdense vessel or unexpected calcification. Skull: Normal. Negative for fracture or focal lesion. Sinuses/Orbits: Visualized paranasal sinuses are clear. Periorbital and retro-orbital soft tissues are unremarkable. Other: None. CT CERVICAL SPINE FINDINGS Alignment: Mild levoscoliosis which may be accentuated by patient positioning. Slight reversal of the normal cervical spine lordosis which could be related to patient positioning or muscle spasm. No evidence of acute vertebral body subluxation. Skull base and vertebrae: No fracture line or displaced fracture fragment seen. No acute or suspicious osseous lesion. Soft tissues and  spinal canal: No prevertebral soft tissue swelling or edema. No hemorrhage seen within the central canal. Disc levels: Mild degenerative spondylosis within the mid to lower cervical spine, most prominent at the C5-6 level with associated disc space narrowing and osseous spurring. Associated chronic appearing wedging of the C5 vertebral body. No more than mild central canal stenosis at any level. Upper chest: Negative. Other: Bilateral carotid atherosclerosis, RIGHT greater than LEFT. IMPRESSION: 1. Negative head CT. No intracranial mass, hemorrhage or edema. No skull fracture. 2. No fracture or acute subluxation within the cervical spine.  Mild degenerative change within the mid to lower cervical spine, most prominent at the C5-6 level. 3. Carotid atherosclerosis, right greater than left. Electronically Signed   By: Franki Cabot M.D.   On: 07/26/2018 16:50   Ct Cervical Spine Wo Contrast  Result Date: 07/26/2018 CLINICAL DATA:  General weakness and neck pain. Woke up with neck pain. EXAM: CT HEAD WITHOUT CONTRAST CT CERVICAL SPINE WITHOUT CONTRAST TECHNIQUE: Multidetector CT imaging of the head and cervical spine was performed following the standard protocol without intravenous contrast. Multiplanar CT image reconstructions of the cervical spine were also generated. COMPARISON:  Head CT dated 01/17/2013. FINDINGS: CT HEAD FINDINGS Brain: Ventricles are stable in size and configuration. There is no mass, hemorrhage, edema or other evidence of acute parenchymal abnormality. No extra-axial hemorrhage. Vascular: No hyperdense vessel or unexpected calcification. Skull: Normal. Negative for fracture or focal lesion. Sinuses/Orbits: Visualized paranasal sinuses are clear. Periorbital and retro-orbital soft tissues are unremarkable. Other: None. CT CERVICAL SPINE FINDINGS Alignment: Mild levoscoliosis which may be accentuated by patient positioning. Slight reversal of the normal cervical spine lordosis which could be related to patient positioning or muscle spasm. No evidence of acute vertebral body subluxation. Skull base and vertebrae: No fracture line or displaced fracture fragment seen. No acute or suspicious osseous lesion. Soft tissues and spinal canal: No prevertebral soft tissue swelling or edema. No hemorrhage seen within the central canal. Disc levels: Mild degenerative spondylosis within the mid to lower cervical spine, most prominent at the C5-6 level with associated disc space narrowing and osseous spurring. Associated chronic appearing wedging of the C5 vertebral body. No more than mild central canal stenosis at any level. Upper chest:  Negative. Other: Bilateral carotid atherosclerosis, RIGHT greater than LEFT. IMPRESSION: 1. Negative head CT. No intracranial mass, hemorrhage or edema. No skull fracture. 2. No fracture or acute subluxation within the cervical spine. Mild degenerative change within the mid to lower cervical spine, most prominent at the C5-6 level. 3. Carotid atherosclerosis, right greater than left. Electronically Signed   By: Franki Cabot M.D.   On: 07/26/2018 16:50   US Abdomen Limited Ruq  Result Date: 07/27/2018 CLINICAL DATA:  Elevated LFTs.  ETOH. EXAM: ULTRASOUND ABDOMEN LIMITED RIGHT UPPER QUADRANT COMPARISON:  None. FINDINGS: Gallbladder: No gallstones or wall thickening visualized. No sonographic Murphy sign noted by sonographer. Common bile duct: Diameter: 5.2 mm. Liver: Moderate diffuse increased parenchymal echogenicity without focal mass. Portal vein is patent on color Doppler imaging with normal direction of blood flow towards the liver. IMPRESSION: No acute hepatobiliary findings. Moderate hepatic steatosis without focal mass. Electronically Signed   By: Marin Olp M.D.   On: 07/27/2018 07:45     Labs:   Basic Metabolic Panel: Recent Labs  Lab 07/27/18 0719  07/28/18 0148 07/28/18 0818 07/28/18 1536 07/29/18 0319 07/30/18 0531 07/31/18 0820  NA  --    < > 141 142 143 140 139 137  K  --    < > 3.0* 2.6* 3.1* 5.5* 5.3* 4.1  CL  --    < > 99 100 101 105 105 106  CO2  --    < > 29 28 27 26 25 23   GLUCOSE  --    < > 93 95 86 101* 77 81  BUN  --    < > 6 5* 5* 6 12 15   CREATININE  --    < > 0.76 0.71 0.71 0.72 0.80 0.89  CALCIUM  --    < > 6.5* 6.9* 6.8* 6.7* 7.3* 7.4*  MG 1.1*  --  1.9  --   --   --   --   --    < > = values in this interval not displayed.   GFR Estimated Creatinine Clearance: 82.7 mL/min (by C-G formula based on SCr of 0.89 mg/dL). Liver Function Tests: Recent Labs  Lab 07/26/18 1600 07/28/18 0148 07/31/18 0820  AST 362* <5* 349*  ALT 126* 118* 122*  ALKPHOS  295* 271* 311*  BILITOT 3.7* 2.7* 2.5*  PROT 6.6 5.9* 5.9*  ALBUMIN 2.5* 2.3* 2.1*   No results for input(s): LIPASE, AMYLASE in the last 168 hours. No results for input(s): AMMONIA in the last 168 hours. Coagulation profile Recent Labs  Lab 07/26/18 1600 07/28/18 0148  INR 1.4* 1.4*    CBC: Recent Labs  Lab 07/26/18 1600 07/27/18 0041 07/27/18 0346 07/28/18 0148 07/29/18 0319 07/30/18 0531  WBC 6.1 5.7 5.8 6.0 6.6 6.3  NEUTROABS 4.3  --   --   --   --   --   HGB 11.0* 10.9* 10.1* 10.2* 9.8* 11.2*  HCT 32.7* 33.6* 31.6* 31.7* 30.3* 35.2*  MCV 101.6* 103.7* 103.3* 103.3* 104.8* 105.7*  PLT 203 212 197 176 195 197   Cardiac Enzymes: No results for input(s): CKTOTAL, CKMB, CKMBINDEX, TROPONINI in the last 168 hours. BNP: Invalid input(s): POCBNP CBG: No results for input(s): GLUCAP in the last 168 hours. D-Dimer No results for input(s): DDIMER in the last 72 hours. Hgb A1c No results for input(s): HGBA1C in the last 72 hours. Lipid Profile No results for input(s): CHOL, HDL, LDLCALC, TRIG, CHOLHDL, LDLDIRECT in the last 72 hours. Thyroid function studies No results for input(s): TSH, T4TOTAL, T3FREE, THYROIDAB in the last 72 hours.  Invalid input(s): FREET3 Anemia work up No results for input(s): VITAMINB12, FOLATE, FERRITIN, TIBC, IRON, RETICCTPCT in the last 72 hours. Microbiology Recent Results (from the past 240 hour(s))  Urine culture     Status: None   Collection Time: 07/26/18  4:00 PM  Result Value Ref Range Status   Specimen Description   Final    URINE, CLEAN CATCH Performed at [facility], [place] [address] [place], [place] [address]    Special Requests   Final    NONE Performed at [facility], [place] [address], [place], [place] [address]    Culture   Final    NO GROWTH Performed at [facility], [place] [address], [place], [place] [address]    Report Status 07/28/2018 FINAL  Final  MRSA PCR Screening      Status: None   Collection Time: 07/26/18 11:55 PM  Result Value Ref Range Status   MRSA by PCR NEGATIVE NEGATIVE Final    Comment:        The GeneXpert MRSA Assay (FDA approved for NASAL specimens only), is one component of a comprehensive MRSA colonization  surveillance program. It is not intended to diagnose MRSA infection nor to guide or monitor treatment for MRSA infections. Performed at [facility], [place] [address], [place], [place] [address]   C difficile quick scan w PCR reflex     Status: None   Collection Time: 07/27/18  6:47 AM  Result Value Ref Range Status   C Diff antigen NEGATIVE NEGATIVE Final   C Diff toxin NEGATIVE NEGATIVE Final   C Diff interpretation No C. difficile detected.  Final    Comment: Performed at [facility], [place] [address], [place], [place] [address]    Signed: Marlowe Aschoff Nija Koopman  [facility] 07/31/2018, 1:50 PM

## 2018-07-31 NOTE — Progress Notes (Signed)
Bilateral lower extremity venous duplex completed. Refer to "CV Proc" under chart review to view preliminary results.  07/31/2018 11:41 AM Maudry Mayhew, MHA, RVT, RDCS, RDMS

## 2018-08-01 ENCOUNTER — Telehealth: Payer: Self-pay | Admitting: *Deleted

## 2018-08-01 NOTE — Telephone Encounter (Signed)
  Patient on TCM report for [facility] F/U. D/C to home on 07/31/18. Left mess for patient to call back to schedule virtual visit with PCP if available. CRM created.

## 2018-08-04 NOTE — Telephone Encounter (Deleted)
Copied from [place] [phone]. Topic: Quick Communication - Home Health Verbal Orders >> Aug 04, 2018 10:31 AM Yvette Rack wrote: Caller/Agency: Darlina Guys with [facility] stated they will be going out on 08/11/18 for skilled nursing evaluation Callback Number: [phone]

## 2018-08-14 ENCOUNTER — Inpatient Hospital Stay: Payer: Self-pay

## 2018-08-18 ENCOUNTER — Telehealth: Payer: Self-pay | Admitting: *Deleted

## 2018-08-18 NOTE — Telephone Encounter (Signed)
Patients Primary office has been reaching out since she was discharged. Patient needs to schedule with [place] office.

## 2018-08-19 ENCOUNTER — Inpatient Hospital Stay: Payer: Self-pay | Admitting: Primary Care

## 2018-08-20 ENCOUNTER — Encounter: Payer: Self-pay | Admitting: Primary Care

## 2018-08-20 ENCOUNTER — Other Ambulatory Visit: Payer: Self-pay

## 2018-08-20 ENCOUNTER — Ambulatory Visit: Payer: Self-pay | Attending: Primary Care | Admitting: Primary Care

## 2018-08-20 VITALS — BP 161/119 | HR 81 | Temp 98.4°F

## 2018-08-20 DIAGNOSIS — E876 Hypokalemia: Secondary | ICD-10-CM

## 2018-08-20 DIAGNOSIS — F1010 Alcohol abuse, uncomplicated: Secondary | ICD-10-CM

## 2018-08-20 DIAGNOSIS — I10 Essential (primary) hypertension: Secondary | ICD-10-CM

## 2018-08-20 DIAGNOSIS — Z9289 Personal history of other medical treatment: Secondary | ICD-10-CM

## 2018-08-20 MED ORDER — HYDROCHLOROTHIAZIDE 25 MG PO TABS: 25.0000 mg | ORAL_TABLET | Freq: Every day | ORAL | 3 refills | Status: DC

## 2018-08-20 MED ORDER — CLONIDINE HCL 0.1 MG PO TABS
0.1000 mg | ORAL_TABLET | Freq: Once | ORAL | Status: AC
  Administered 2018-08-20: 0.1 mg via ORAL

## 2018-08-20 MED ORDER — AMLODIPINE BESYLATE 10 MG PO TABS: 10.0000 mg | ORAL_TABLET | Freq: Every day | ORAL | 3 refills | Status: AC

## 2018-08-20 NOTE — Progress Notes (Signed)
Hospital follow up: Calcium and K+ was low,  leg swollen and fatigue has gotten better  Medication refills: blood pressure medication  Hemorrhoids is bugging her    Patient states she is coming to this office due to not having any insurance

## 2018-08-20 NOTE — Addendum Note (Signed)
Addended by: Emilio Aspen A on: 08/20/2018 12:14 PM   Modules accepted: Orders

## 2018-08-20 NOTE — Progress Notes (Signed)
New Patient Office Visit  Subjective:  Patient ID: Deborah Cabrera, female    DOB: 06-26-1960  Age: 58 y.o. MRN: [id number]  CC:  Chief Complaint  Patient presents with  . Medication Refill  . Hospitalization Follow-up    Calcium and K+ was low, leg swollen and fatigue    HPI Deborah Cabrera present today to establish care after hospitalization.  Past medical history significant of hypertension, hyperlipidemia, alcohol abuse drinking brandy every night, iron deficiency anemia,  uterine fibroids, morbid obesity.  Incontinence to both urine and feces resolved.  Past Medical History:  Diagnosis Date  . Anemia    Hgb 4 01/2013 due to menometorhagia  . Menometrorrhagia   . Pyelonephritis 01/16/2013   R PN -Ecoli - hosp for same  . Thrombocytopenia ([place]) 01/16/13   in setting of pyelonephritis hosp, 1 plt pk transfusion when plt 27K  . Uterine fibroids    dx 01/2013 by [place] in setting of severe iron def anemia    Past Surgical History:  Procedure Laterality Date  . TUBAL LIGATION Bilateral     Family History  Problem Relation Age of Onset  . Hypertension Mother   . Osteoarthritis Mother   . Hyperlipidemia Mother   . Kidney failure Father        ESRD since age 46  . Alcohol abuse Father   . Dementia Father   . Ovarian cancer Sister 52       died of same age 37  . Dementia Paternal Grandmother   . Stroke Maternal Grandmother 36  . Diabetes Mellitus II Son   . Diabetes Sister   . Colon cancer Neg Hx     Social History   Socioeconomic History  . Marital status: Single    Spouse name: Not on file  . Number of children: Not on file  . Years of education: Not on file  . Highest education level: Not on file  Occupational History  . 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 Financial resource strain: Not on file  . Food insecurity:    Worry: Not on file    Inability: Not on file  . Transportation needs:    Medical: Not on  file    Non-medical: Not on file  Tobacco Use  . Smoking status: Current Some Day Smoker    Types: Cigarettes  . Smokeless tobacco: Never Used  Substance and Sexual Activity  . Alcohol use: Yes    Alcohol/week: 0.0 standard drinks    Comment: Socially  . Drug use: No    Frequency: 2.0 times per week  . Sexual activity: Yes    Birth control/protection: None  Lifestyle  . Physical activity:    Days per week: Not on file    Minutes per session: Not on file  . Stress: Not on file  Relationships  . Social connections:    Talks on phone: Not on file    Gets together: Not on file    Attends religious service: Not on file    Active member of club or organization: Not on file    Attends meetings of clubs or organizations: Not on file    Relationship status: Not on file  . Intimate partner violence:    Fear of current or ex partner: Not on file    Emotionally abused: Not on file    Physically abused: Not on file  Forced sexual activity: Not on file  Other Topics Concern  . Not on file  Social History Narrative   Lives with 13 yo son - grown 52[facility] dtr in [facility]   Single   employed as Materials engineer    ROS Review of Systems  Constitutional: Positive for fatigue.  HENT: Negative.   Eyes: Negative.   Gastrointestinal: Positive for abdominal pain.  Endocrine: Negative.   Genitourinary: Negative.   Skin: Negative.   Allergic/Immunologic: Negative.   Neurological: Positive for weakness.  Hematological: Negative.   Psychiatric/Behavioral: Negative.     Objective:   Today's Vitals: There were no vitals taken for this visit.  Physical Exam Vitals signs and nursing note reviewed.  Constitutional:      Appearance: Normal appearance. She is obese.  HENT:     Head: Normocephalic and atraumatic.     Right Ear: Tympanic membrane normal.     Left Ear: Tympanic membrane normal.  Neck:     Musculoskeletal: Normal range of motion and neck supple.  Cardiovascular:     Rate and  Rhythm: Normal rate and regular rhythm.     Heart sounds: Normal heart sounds.  Pulmonary:     Effort: Pulmonary effort is normal.     Breath sounds: Normal breath sounds.  Abdominal:     General: Bowel sounds are normal. There is distension.  Musculoskeletal: Normal range of motion.  Skin:    General: Skin is warm.  Neurological:     General: No focal deficit present.     Mental Status: She is alert.  Psychiatric:        Mood and Affect: Mood normal.     Assessment & Plan:   Problem List Items Addressed This Visit    None      Outpatient Encounter Medications as of 08/20/2018  Medication Sig  . folic acid (FOLVITE) 1 MG tablet Take 1 tablet (1 mg total) by mouth daily for 30 days.  Marland Kitchen losartan (COZAAR) 50 MG tablet Take 1 tablet (50 mg total) by mouth daily.  . Magnesium 250 MG TABS Take 2 tablets by mouth 2 (two) times daily.  . Multiple Vitamin (MULTIVITAMIN WITH MINERALS) TABS tablet Take 1 tablet by mouth daily.  . Omega-3 1000 MG CAPS Taking daily  . Potassium Gluconate 550 MG TABS Take 2 tablets by mouth 2 (two) times daily.  Marland Kitchen thiamine 100 MG tablet Take 1 tablet (100 mg total) by mouth daily for 30 days.  . Thiamine HCl (VITAMIN B-1 PO) Take by mouth daily.   No facility-administered encounter medications on file as of 08/20/2018.   Nattalie was seen today for medication refill and hospitalization follow-up.  Diagnoses and all orders for this visit:  Essential hypertension This is a chronic problem. The current episode started more than 1 year ago. The problem occurs daily. The problem has been gradually worsening. Associated symptoms include abdominal pain, fatigue and weakness. The symptoms are aggravated by drinking and smoking. She has tried nothing for the symptoms. The treatment provided no relief.  Change and added medication . -     CBC  Alcohol abuse Patient states she has not had any alcohol since discharge from the hospital. -     Comprehensive metabolic  panel  Hypokalemia Prescribed supplement at hospital discharge. Refilled and check -     Comprehensive metabolic panel  Hypomagnesemia Prescribed supplement at hospital discharge. Refilled and check levels  -     Magnesium  Hospitalization within last 30 days  Referred to GI per discharge   Follow-up: No follow-ups on file.   Kerin Perna, NP

## 2018-08-21 LAB — CBC
Hematocrit: 36.7 % (ref 34.0–46.6)
Hemoglobin: 12.5 g/dL (ref 11.1–15.9)
MCH: 32.6 pg (ref 26.6–33.0)
MCHC: 34.1 g/dL (ref 31.5–35.7)
MCV: 96 fL (ref 79–97)
Platelets: 423 10*3/uL (ref 150–450)
RBC: 3.84 x10E6/uL (ref 3.77–5.28)
RDW: 14 % (ref 11.7–15.4)
WBC: 4.4 10*3/uL (ref 3.4–10.8)

## 2018-08-21 LAB — MAGNESIUM: Magnesium: 2 mg/dL (ref 1.6–2.3)

## 2018-08-21 LAB — COMPREHENSIVE METABOLIC PANEL
ALT: 47 IU/L — ABNORMAL HIGH (ref 0–32)
AST: 74 IU/L — ABNORMAL HIGH (ref 0–40)
Albumin/Globulin Ratio: 1.3 (ref 1.2–2.2)
Albumin: 4 g/dL (ref 3.8–4.9)
Alkaline Phosphatase: 208 IU/L — ABNORMAL HIGH (ref 39–117)
BUN/Creatinine Ratio: 11 (ref 9–23)
BUN: 7 mg/dL (ref 6–24)
Bilirubin Total: 0.9 mg/dL (ref 0.0–1.2)
CO2: 22 mmol/L (ref 20–29)
Calcium: 10.2 mg/dL (ref 8.7–10.2)
Chloride: 100 mmol/L (ref 96–106)
Creatinine, Ser: 0.66 mg/dL (ref 0.57–1.00)
GFR calc Af Amer: 114 mL/min/{1.73_m2} (ref >59)
GFR calc non Af Amer: 99 mL/min/{1.73_m2} (ref >59)
Globulin, Total: 3.1 g/dL (ref 1.5–4.5)
Glucose: 75 mg/dL (ref 65–99)
Potassium: 4.5 mmol/L (ref 3.5–5.2)
Sodium: 141 mmol/L (ref 134–144)
Total Protein: 7.1 g/dL (ref 6.0–8.5)

## 2018-08-27 ENCOUNTER — Ambulatory Visit (INDEPENDENT_AMBULATORY_CARE_PROVIDER_SITE_OTHER): Payer: Self-pay | Admitting: Internal Medicine

## 2018-08-27 ENCOUNTER — Other Ambulatory Visit: Payer: Self-pay

## 2018-08-27 ENCOUNTER — Encounter: Payer: Self-pay | Admitting: Internal Medicine

## 2018-08-27 VITALS — Ht 67.0 in | Wt 190.0 lb

## 2018-08-27 DIAGNOSIS — F1020 Alcohol dependence, uncomplicated: Secondary | ICD-10-CM

## 2018-08-27 DIAGNOSIS — K7010 Alcoholic hepatitis without ascites: Secondary | ICD-10-CM

## 2018-08-27 DIAGNOSIS — K700 Alcoholic fatty liver: Secondary | ICD-10-CM

## 2018-08-27 DIAGNOSIS — R197 Diarrhea, unspecified: Secondary | ICD-10-CM

## 2018-08-27 DIAGNOSIS — E669 Obesity, unspecified: Secondary | ICD-10-CM

## 2018-08-27 DIAGNOSIS — Z862 Personal history of diseases of the blood and blood-forming organs and certain disorders involving the immune mechanism: Secondary | ICD-10-CM

## 2018-08-27 DIAGNOSIS — R945 Abnormal results of liver function studies: Secondary | ICD-10-CM

## 2018-08-27 DIAGNOSIS — R7989 Other specified abnormal findings of blood chemistry: Secondary | ICD-10-CM

## 2018-08-27 NOTE — Progress Notes (Signed)
HISTORY OF PRESENT ILLNESS:  Deborah Cabrera is a 58 y.o. female who schedules this appointment on the advice of her primary provider Deborah Cabrera nurse practitioner) regarding recent hospitalization for diarrhea, dehydration, electrolyte abnormalities, and abnormal liver tests felt secondary to alcoholic hepatitis.  Patient has not been seen in this office since 2014 when she was evaluated for iron deficiency anemia.  Colonoscopy March 10, 2013 revealed normal ileum.  Moderate diverticulosis.  Otherwise normal.  Follow-up in 10 years recommended upper endoscopy revealed gastritis.  Otherwise normal.  Told to avoid NSAIDs and continue iron replacement.  Has not been seen since.  Hemoglobin normalized.  In recent months the patient has been drinking significant quantities of alcohol.  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  She ended up developing an acute diarrheal illness with prostration for which she required hospitalization July 26, 2018.  She was found to be significantly dehydrated with hypokalemia, hypomagnesemia, and elevated liver tests with AST 349, ALT 122, alkaline phosphatase 311, and bilirubin 2.5.  Stool studies for C. difficile were negative.  Other laboratories were remarkable for a prothrombin time of 17.1.  Alcohol level and drug screen on admission were both negative.  Right upper quadrant ultrasound was performed and revealed moderate hepatic steatosis without other abnormalities.  The gallbladder was unremarkable as was the bile duct.  She was discharged home July 31, 2018.  Patient tells me that she has been abstinent from alcohol since that time.  She did have a follow-up visit with her PCPs office August 20, 2018.  Laboratories at that time were markedly improved.  Basic metabolic panel was normal as was magnesium.  Liver tests were also markedly improved, though still mildly abnormal with AST 74, ALT 47, alkaline  phosphatase 208, and bilirubin 0.9.  CBC on that day was also normal with hemoglobin 12.5 and platelets 423,000.  Patient tells me that she has been completely abstinent from alcohol since her hospital discharge.  She also tells me that she is feeling significantly better.  No active complaints at this time.  GI review of systems is currently negative.  REVIEW OF SYSTEMS:  All non-GI ROS negative unless otherwise stated in the HPI  Past Medical History:  Diagnosis Date  . Anemia    Hgb 4 01/2013 due to menometorhagia  . Menometrorrhagia   . Pyelonephritis 01/16/2013   R PN -Ecoli - hosp for same  . Thrombocytopenia ([place]) 01/16/13   in setting of pyelonephritis hosp, 1 plt pk transfusion when plt 27K  . Uterine fibroids    dx 01/2013 by [place] in setting of severe iron def anemia    Past Surgical History:  Procedure Laterality Date  . TUBAL LIGATION Bilateral     Social History Deborah Cabrera  reports that she has been smoking cigarettes. She has never used smokeless tobacco. She reports current alcohol use. She reports that she does not use drugs.  family history includes Alcohol abuse in her father; Dementia in her father and paternal grandmother; Diabetes in her sister; Diabetes Mellitus II in her son; Hyperlipidemia in her mother; Hypertension in her mother; Kidney failure in her father; Osteoarthritis in her mother; Ovarian cancer (age of onset: 73) in her sister; Stroke (age of onset: 68) in her maternal grandmother.  No Known Allergies     PHYSICAL EXAMINATION: Vital signs: Ht 5\' 7"  (1.702 m)   Wt 190 lb (86.2 kg)  BMI 29.76 kg/m   Constitutional: generally well-appearing, no acute distress Psychiatric: alert and oriented x3, cooperative No additional exam information available due to telemedicine visit  ASSESSMENT:  1.  Acute alcoholic hepatitis.  Improving 2.  Fatty liver on ultrasound secondary to chronic alcoholism and obesity 3.  Obesity 4.  Acute diarrheal illness  which was associated with dehydration and electrolyte abnormalities necessitating hospitalization.  Corrected and improved 5.  Colonoscopy 2014 with diverticulosis 6.  History of iron deficiency anemia   PLAN:  1.  Avoid all alcohol FOREVER.  We discussed the effects of chronic alcohol use on susceptible individuals.  We focused on hepatic cirrhosis 2.  RECHECK LIVER tests IN 4 WEEKS.  I will send this to my [facility].  We will contact the patient with the results at that time when they are available 3.  Exercise 4.  Weight loss 5.  Surveillance colonoscopy around 2024 6.  GI follow-up to be determined after the follow-up liver tests results are known  This telemedicine audiovisual WebEx appointment (during the coronavirus pandemic) was initiated by the patient who consented for the visit as well.  She was in her home and I was in my office during the encounter.  She understands her may be an associated professional fever charge for this service.

## 2018-08-28 NOTE — Addendum Note (Signed)
Addended by: Wyline Beady on: 08/28/2018 08:52 AM   Modules accepted: Orders

## 2018-09-17 ENCOUNTER — Other Ambulatory Visit: Payer: Self-pay

## 2018-09-17 ENCOUNTER — Encounter: Payer: Self-pay | Admitting: Pharmacist

## 2018-09-17 ENCOUNTER — Ambulatory Visit: Payer: Self-pay | Attending: Primary Care | Admitting: Pharmacist

## 2018-09-17 VITALS — BP 111/78 | HR 102

## 2018-09-17 DIAGNOSIS — I10 Essential (primary) hypertension: Secondary | ICD-10-CM

## 2018-09-17 NOTE — Progress Notes (Signed)
   S:    PCP: Juluis Mire  Patient arrives in good spirits. Presents to the clinic for hypertension management. Patient was referred by Sharyn Lull today. Last PCP visit was 08/20/18 - BP 161/119 at that visit. Michelle started HCTZ and amlodipine.  Patient reports adherence with medications. Denies chest pain, dyspnea, blurred vision, or HA. Denies BLE edema.   Current BP Medications include:  Amlodipine 10 mg, HCTZ 25 mg daily  Dietary habits include: limits salt, denies drinking caffeine daily Exercise habits include: denies  Family / Social history:  - FHx: DM (sister), Hyperlipidemia (mother), HTN (father) - Current 0.5 PPD smoker - Drinks occasionally  Home BP readings: just obtained device yesterday  O:  L arm after 5 minutes rest: 111/78, HR 102 Last 3 Office BP readings: BP Readings from Last 3 Encounters:  09/17/18 111/78  08/20/18 (!) 161/119  07/31/18 102/75   BMET    Component Value Date/Time   NA 141 08/20/2018 1008   K 4.5 08/20/2018 1008   CL 100 08/20/2018 1008   CO2 22 08/20/2018 1008   GLUCOSE 75 08/20/2018 1008   GLUCOSE 81 07/31/2018 0820   BUN 7 08/20/2018 1008   CREATININE 0.66 08/20/2018 1008   CALCIUM 10.2 08/20/2018 1008   GFRNONAA 99 08/20/2018 1008   GFRAA 114 08/20/2018 1008    Renal function: CrCl cannot be calculated (Patient's most recent lab result is older than the maximum 21 days allowed.).  Clinical ASCVD: No  The 10-year ASCVD risk score Mikey Bussing DC Jr., et al., 2013) is: 3.7%   Values used to calculate the score:     Age: 51 years     Sex: Female     Is Non-Hispanic African American: Yes     Diabetic: No     Tobacco smoker: Yes     Systolic Blood Pressure: 454 mmHg     Is BP treated: Yes     HDL Cholesterol: 95.8 mg/dL     Total Cholesterol: 188 mg/dL  A/P: Hypertension longstanding currently controlled on current medications. BP Goal <130/80 mmHg. Patient is adherent with current medications.  -Continued current  regimen.  -Counseled on lifestyle modifications for blood pressure control including reduced dietary sodium, increased exercise, adequate sleep -Pneumovax indicated with smoking hx - pt declines for now.  -Declined pharmacotherapy for smoking cessation  Results reviewed and written information provided.   Total time in face-to-face counseling 15 minutes.   F/U Clinic Visit w/ PCP 11/17/18.    Benard Halsted, PharmD, [place] [phone]

## 2018-09-17 NOTE — Patient Instructions (Signed)

## 2018-11-17 ENCOUNTER — Ambulatory Visit (INDEPENDENT_AMBULATORY_CARE_PROVIDER_SITE_OTHER): Payer: Self-pay | Admitting: Primary Care

## 2019-08-15 ENCOUNTER — Ambulatory Visit: Payer: Self-pay | Attending: Internal Medicine

## 2019-08-15 DIAGNOSIS — Z23 Encounter for immunization: Secondary | ICD-10-CM

## 2019-08-15 NOTE — Progress Notes (Signed)
   Covid-19 Vaccination Clinic  Name:  Deborah Cabrera    MRN: [id number] DOB: 1961/04/08  08/15/2019  Ms. Deborah Cabrera was observed post Covid-19 immunization for 15 minutes without incident. She was provided with Vaccine Information Sheet and instruction to access the V-Safe system.   Ms. Deborah Cabrera was instructed to call 911 with any severe reactions post vaccine: Marland Kitchen Difficulty breathing  . Swelling of face and throat  . A fast heartbeat  . A bad rash all over body  . Dizziness and weakness   Immunizations Administered    Name Date Dose VIS Date Route   Moderna COVID-19 Vaccine 08/15/2019 12:54 PM 0.5 mL 04/07/2019 Intramuscular   Manufacturer: Moderna   [id number]   [place]:[id number]

## 2019-09-12 ENCOUNTER — Ambulatory Visit: Payer: Self-pay | Attending: Internal Medicine

## 2019-09-12 DIAGNOSIS — Z23 Encounter for immunization: Secondary | ICD-10-CM

## 2019-09-12 NOTE — Progress Notes (Signed)
   Covid-19 Vaccination Clinic  Name:  Deborah Cabrera    MRN: [id number] DOB: [birth year]  09/12/2019  Deborah Cabrera was observed post Covid-19 immunization for 15 minutes without incident. She was provided with Vaccine Information Sheet and instruction to access the V-Safe system.   Deborah Cabrera was instructed to call 911 with any severe reactions post vaccine: Marland Kitchen Difficulty breathing  . Swelling of face and throat  . A fast heartbeat  . A bad rash all over body  . Dizziness and weakness   Immunizations Administered    Name Date Dose VIS Date Route   Moderna COVID-19 Vaccine 09/12/2019 10:01 AM 0.5 mL 04/2019 Intramuscular   Manufacturer: Moderna   Lot: [id number]   [place]:[id number]

## 2019-11-20 ENCOUNTER — Other Ambulatory Visit: Payer: Self-pay | Admitting: Primary Care

## 2020-08-31 IMAGING — US ULTRASOUND ABDOMEN LIMITED
1 series · 14 of 25 positions shown · non-contrast
Comparison: None.

CLINICAL DATA: Elevated LFTs.  ETOH.

EXAM:
ULTRASOUND ABDOMEN LIMITED RIGHT UPPER QUADRANT

[Series 1: ultrasound abdomen limited · 14 of 39 slices shown]
[im 1/39]
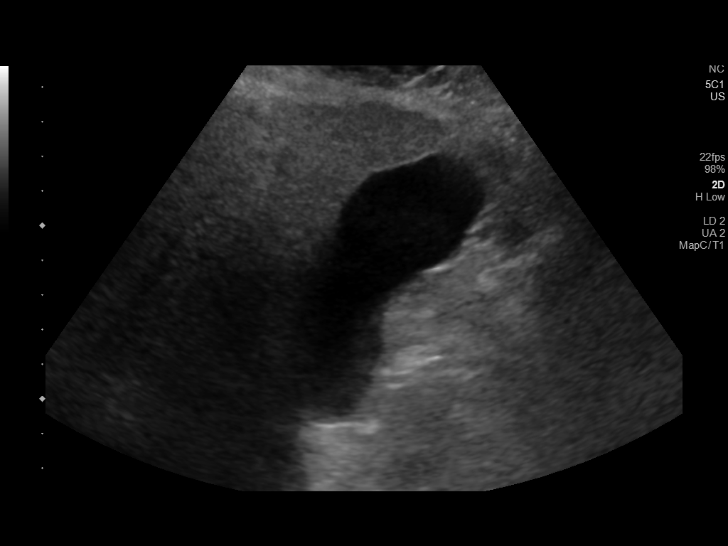
[im 4/39]
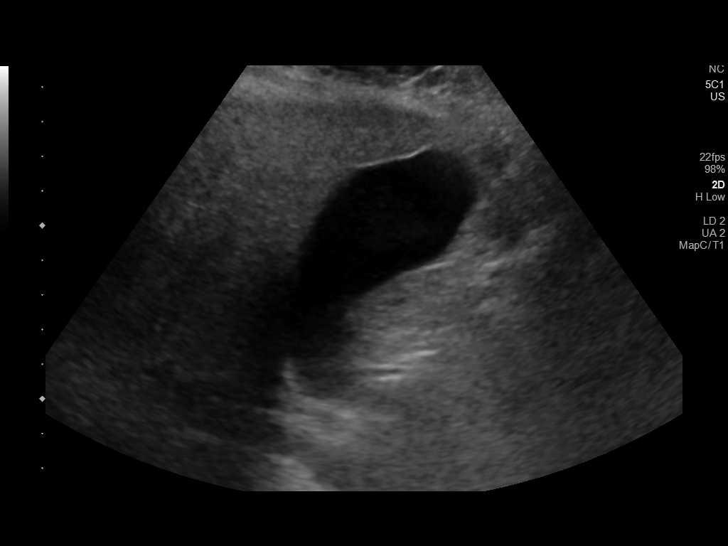
[im 7/39]
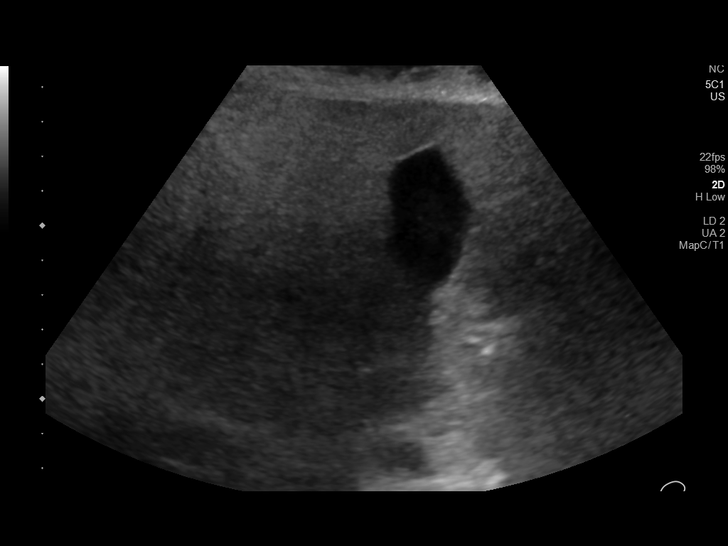
[im 10/39]
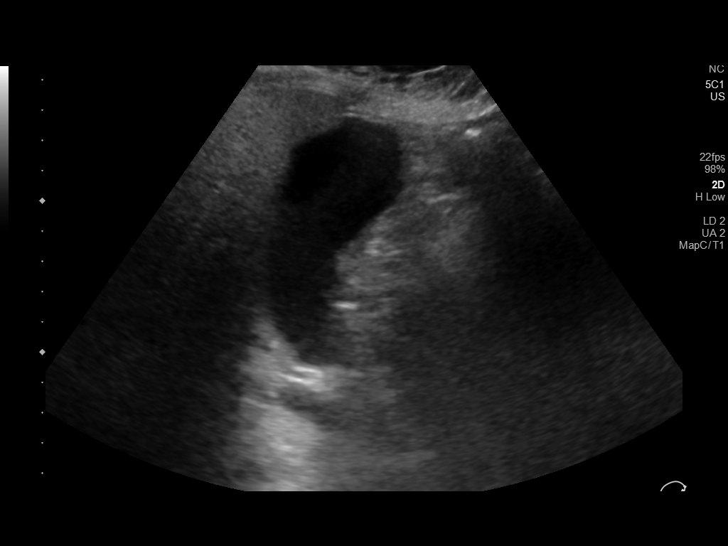
[im 13/39]
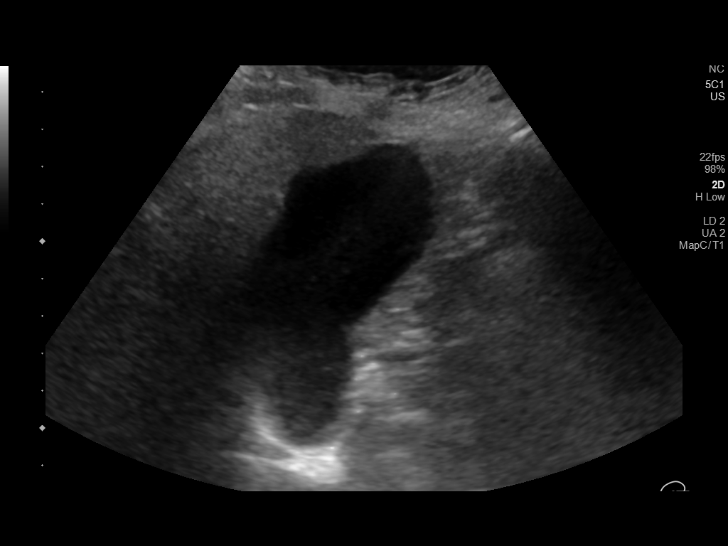
[im 15/39]
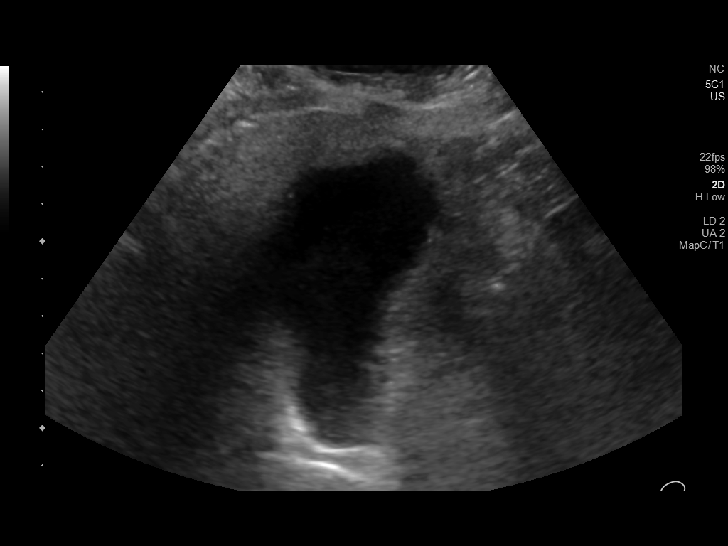
[im 18/39]
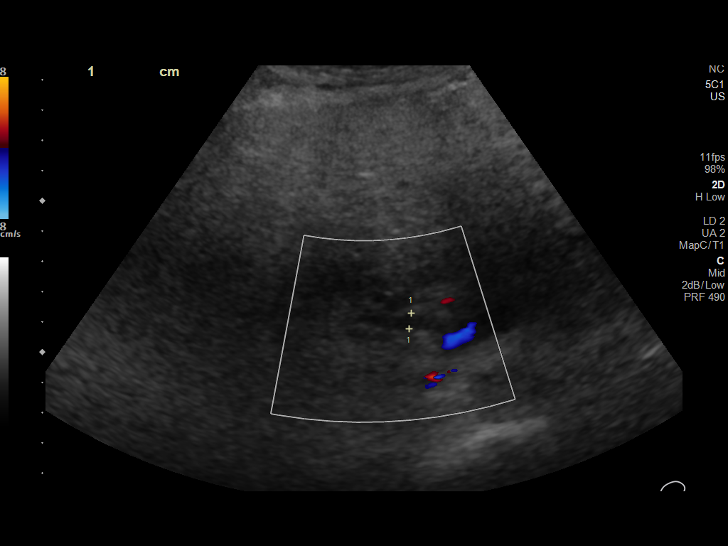
[im 21/39]
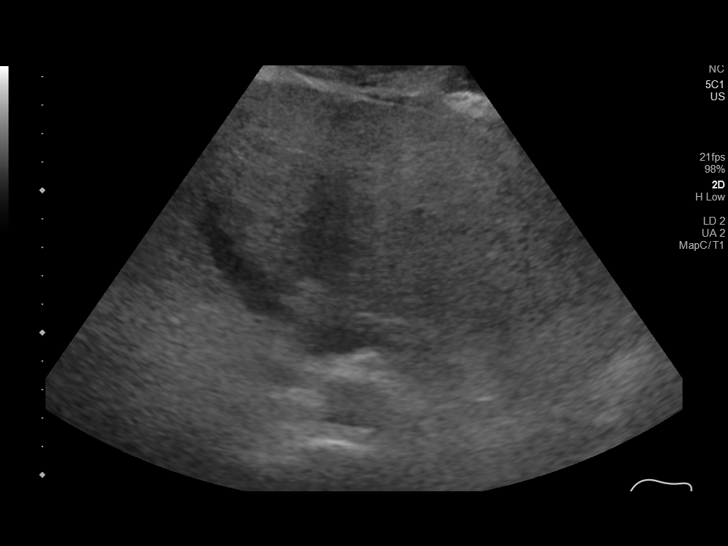
[im 24/39]
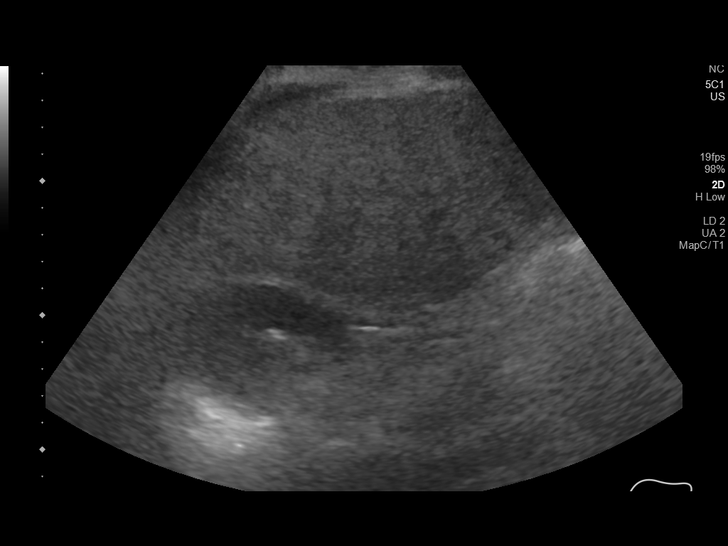
[im 26/39]
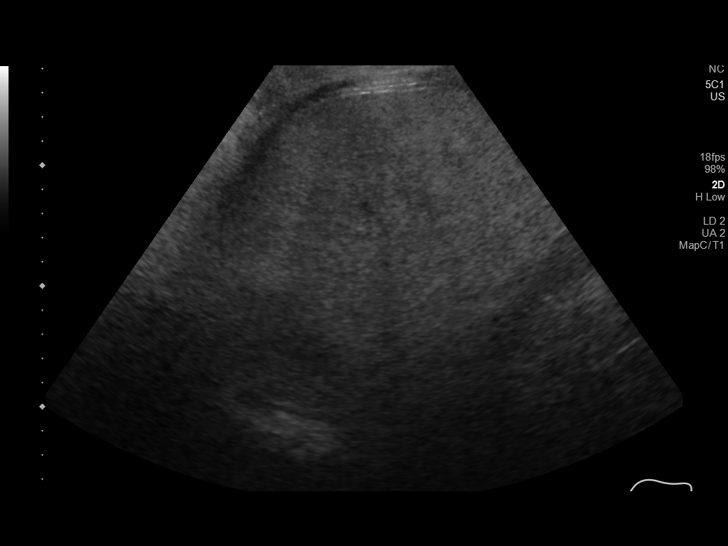
[im 29/39]
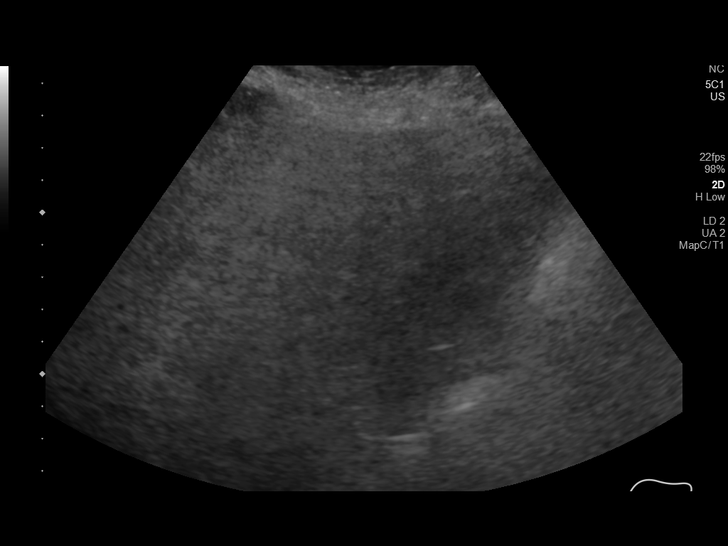
[im 32/39]
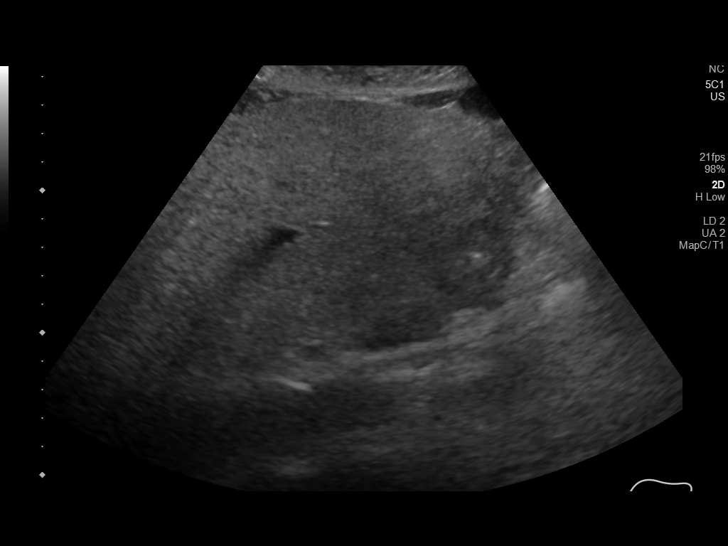
[im 35/39]
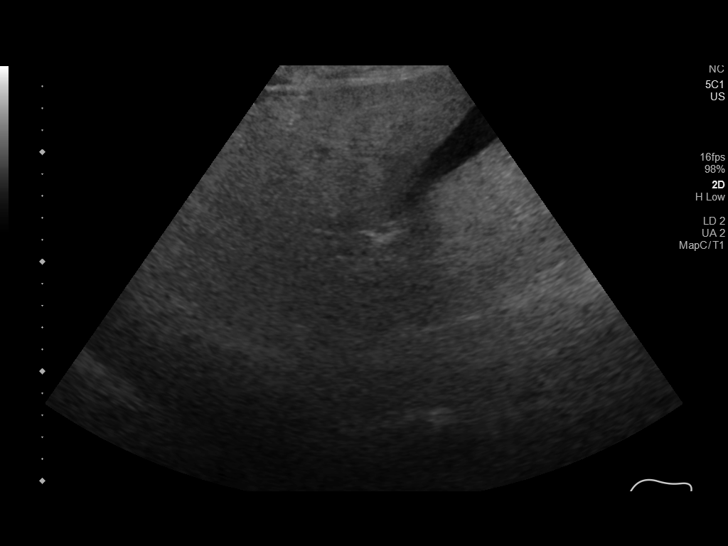
[im 39/39]
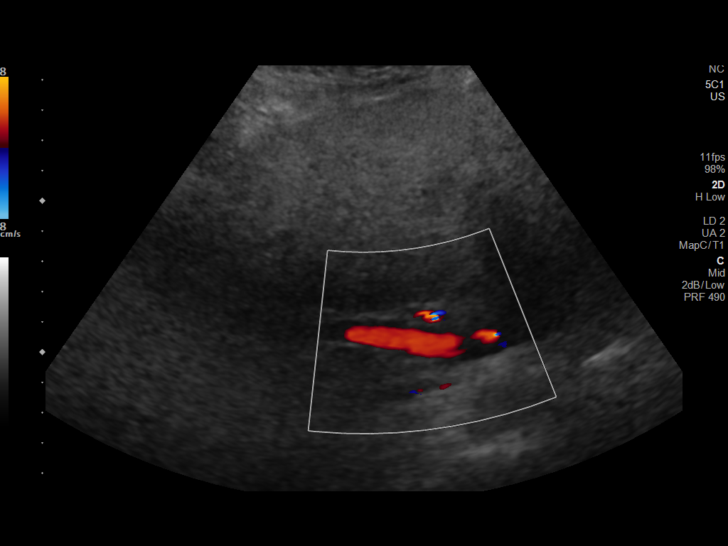

[14 of 25 positions shown; findings below may reference images not displayed]

FINDINGS: Gallbladder:

No gallstones or wall thickening visualized. No sonographic Murphy
sign noted by sonographer.

Common bile duct:

Diameter: 5.2 mm.

Liver:

Moderate diffuse increased parenchymal echogenicity without focal
mass. Portal vein is patent on color Doppler imaging with normal
direction of blood flow towards the liver.
IMPRESSION: No acute hepatobiliary findings.

Moderate hepatic steatosis without focal mass.

## 2020-09-01 IMAGING — DX ABDOMEN - 2 VIEW
3 series · 3 of 3 positions shown · non-contrast
Comparison: None.

CLINICAL DATA: Abdominal pain and diarrhea

EXAM:
ABDOMEN - 2 VIEW

[abdomen supine]
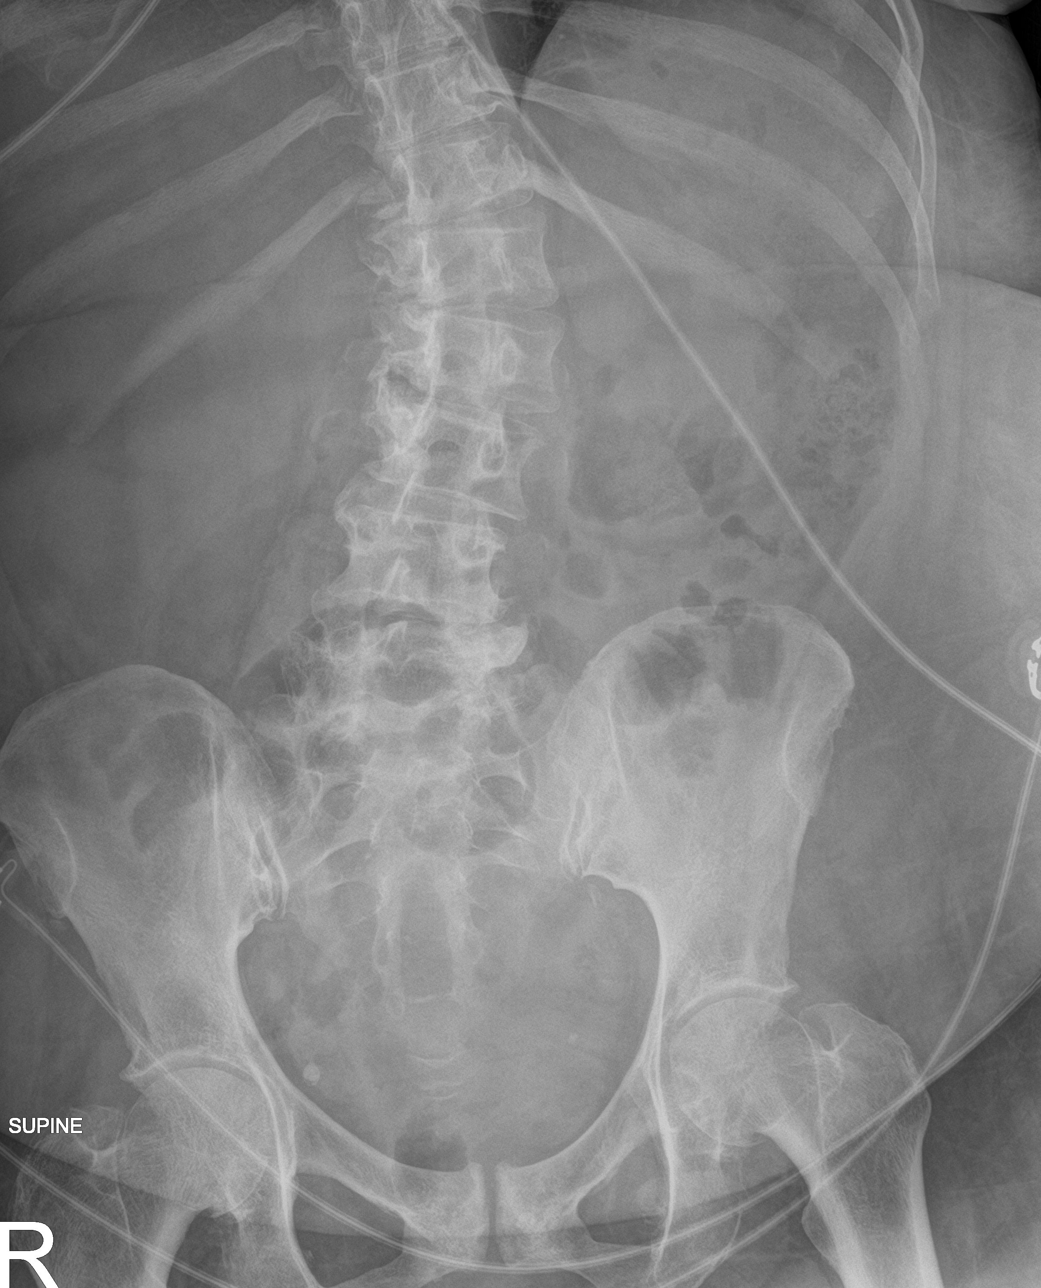

[abdomen decu (1 of 2)]
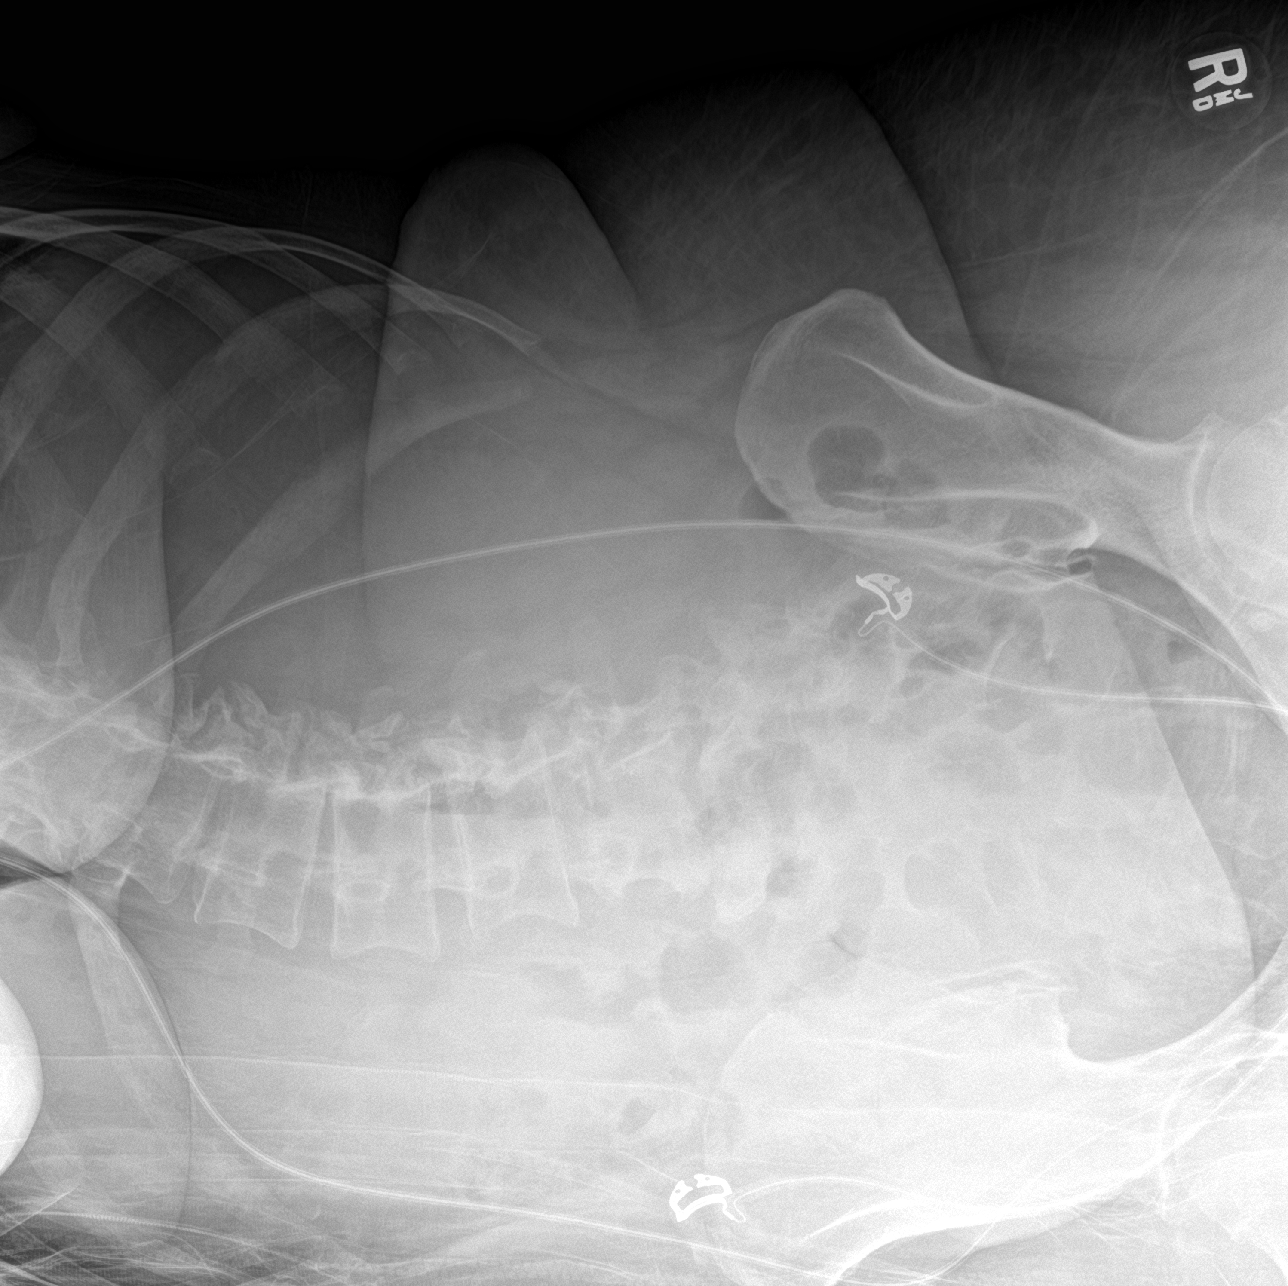

[abdomen decu (2 of 2)]
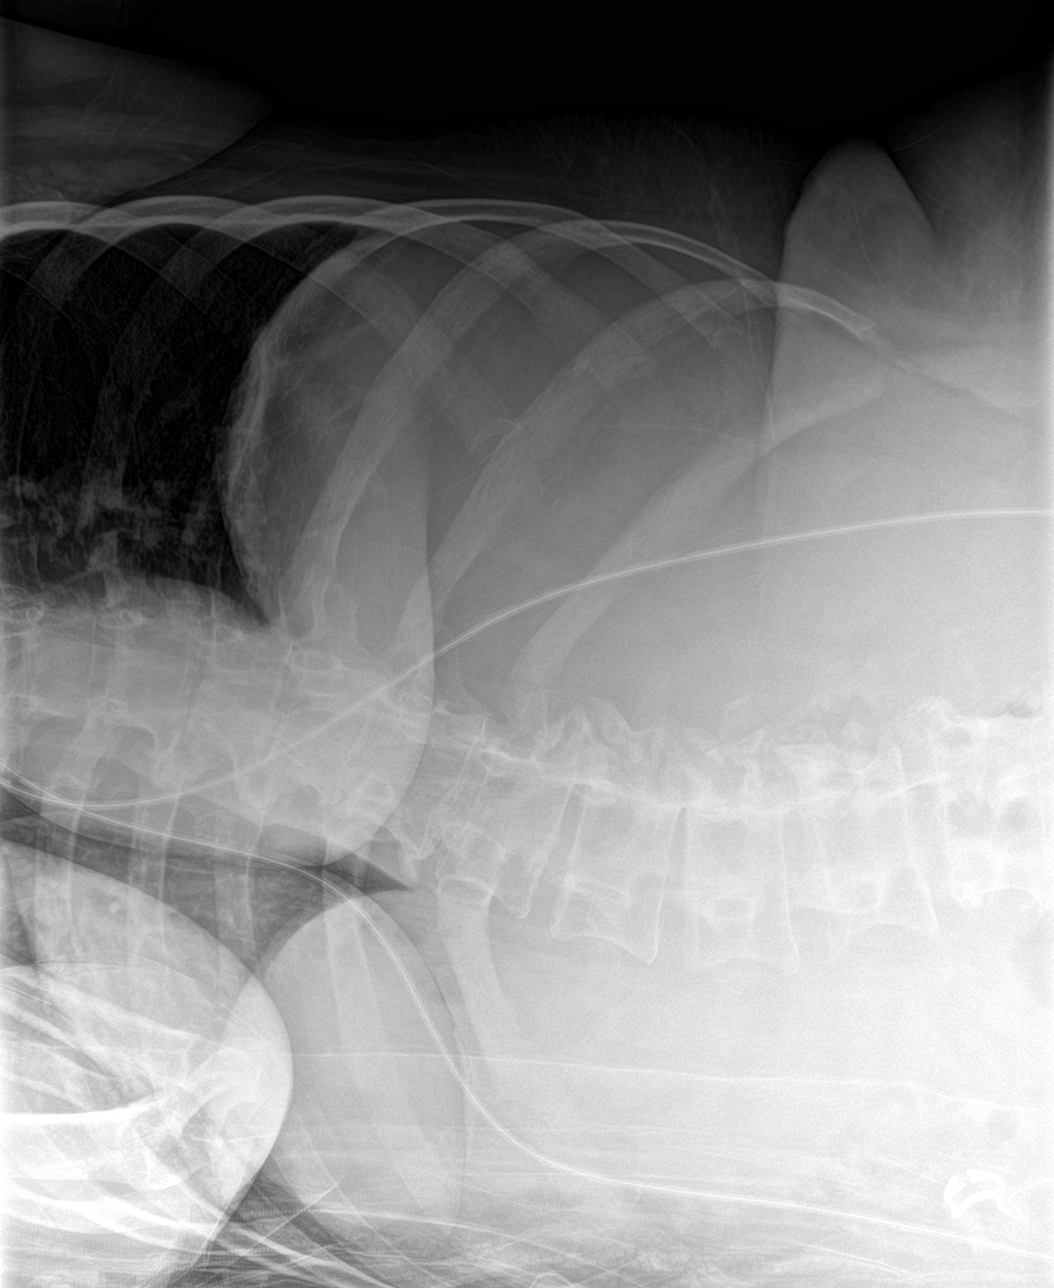

[3 of 3 positions shown; findings below may reference images not displayed]

FINDINGS: Scattered large and small bowel gas is noted. No obstructive changes
are seen. No free air is noted. Mild scoliosis of the lumbar spine
is noted concave to the right compensatory from findings seen on
recent chest x-ray.
IMPRESSION: No acute abnormality noted.

## 2023-03-12 ENCOUNTER — Encounter: Payer: Self-pay | Admitting: Internal Medicine
